# Patient Record
Sex: Male | Born: 1955 | Race: White | Hispanic: No | Marital: Married | Smoking: Never smoker
Health system: Southern US, Community
[De-identification: ages and names within clinical notes are randomized; demographics above are authoritative.]

## PROBLEM LIST (undated history)

## (undated) DIAGNOSIS — N200 Calculus of kidney: Secondary | ICD-10-CM

## (undated) DIAGNOSIS — K219 Gastro-esophageal reflux disease without esophagitis: Secondary | ICD-10-CM

## (undated) DIAGNOSIS — G4730 Sleep apnea, unspecified: Secondary | ICD-10-CM

## (undated) DIAGNOSIS — M1990 Unspecified osteoarthritis, unspecified site: Secondary | ICD-10-CM

## (undated) DIAGNOSIS — J40 Bronchitis, not specified as acute or chronic: Secondary | ICD-10-CM

## (undated) DIAGNOSIS — I10 Essential (primary) hypertension: Secondary | ICD-10-CM

## (undated) HISTORY — PX: COLONOSCOPY: SHX174

## (undated) HISTORY — PX: KNEE SURGERY: SHX244

---
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
## 2003-12-19 ENCOUNTER — Ambulatory Visit (HOSPITAL_COMMUNITY): Admission: RE | Admit: 2003-12-19 | Discharge: 2003-12-19 | Payer: Self-pay | Admitting: Urology

## 2003-12-20 ENCOUNTER — Ambulatory Visit (HOSPITAL_COMMUNITY): Admission: RE | Admit: 2003-12-20 | Discharge: 2003-12-20 | Payer: Self-pay | Admitting: Urology

## 2004-12-30 ENCOUNTER — Ambulatory Visit: Payer: Self-pay | Admitting: Internal Medicine

## 2009-02-23 HISTORY — PX: CHOLECYSTECTOMY: SHX55

## 2010-01-02 ENCOUNTER — Encounter: Admission: RE | Admit: 2010-01-02 | Discharge: 2010-01-02 | Payer: Self-pay | Admitting: Neurosurgery

## 2010-01-24 ENCOUNTER — Encounter: Admission: RE | Admit: 2010-01-24 | Discharge: 2010-01-24 | Payer: Self-pay | Admitting: Neurosurgery

## 2010-07-11 NOTE — H&P (Signed)
Benjamin Sawyer, GUCCIARDO NO.:  [id number]   MEDICAL RECORD NO.:  [id number]          PATIENT TYPE:  AMB   LOCATION:  DAY                           FACILITY:  [facility]   PHYSICIAN:  Dennie Maizes, M.D.   DATE OF BIRTH:  1955/09/08   DATE OF ADMISSION:  12/19/2003  DATE OF DISCHARGE:  LH                                HISTORY & PHYSICAL   CHIEF COMPLAINT:  Severe right flank pain radiating to the front,  right  renal calculi.   HISTORY OF PRESENT ILLNESS:  This 55 year old male has a past history of  recurrent urolithiasis.  He was having intermittent right flank pain for a  few weeks.  He had been to the Emergency Room at [facility]  for further evaluation.  A CT scan of the abdomen and pelvis done on November 28, 2003 revealed bilateral multiple renal calculi.  The largest stone on the  right side measured 7 millimeters in size.  The largest stone on the left  side measured 8 millimeters in size.  There was no evidence of any  hydronephrosis or ureteral calculi.  The patient was treated with Percocet.  He experienced severe pain and he returned to the hospital on December 07, 2003.  He was hospitalized for two days with severe pain.  He had relief of  pain and he has been discharged.  He was brought to the [facility] today  for extracorporal shock-wave lithotripsy for symptomatic right renal  calculus.   He has had several stones in the past.  He has undergone ureteroscopy and  stone extraction in 1998 and 2003.  There is no history of fever, chills,  voiding difficulty, gross hematuria or dysuria at present.   PAST MEDICAL HISTORY:  1.  Recurrent urolithiasis, status post uteroscopy and stone extraction 1998      and 2003.  2.  History of essential hypertension.   MEDICATIONS:  1.  Norvasc 5 mg 1 p.o. daily.  2.  Percocet p.r.n. for pain.   ALLERGIES:  NONE.   FAMILY HISTORY:  Positive for recurrent urolithiasis.   PHYSICAL EXAMINATION:   HEAD/EYES/EARS/NOSE/THROAT:  Normal.  NECK:  No masses.  LUNGS:  Clear to auscultation.  HEART:  Regular rate and rhythm, no murmurs.  ABDOMEN:  Soft, no palpable flank mass.  Moderate right cva tenderness was  noted,  bladder not palpable.  GU:  Penis and testes were normal.   IMPRESSION:  Right flank pain, right renal calculus, bilateral renal  calculi.   PLAN:  Extracorporal shock-wave lithotripsy of right renal calculus with  intravenous sedation in [facility].  I have discussed with the patient  regarding the diagnosis, the operative details, alternate treatments,  outcome, possible risks and complications, and he has agreed for the  procedure to be done.     Pennie Rushing   SK/MEDQ  D:  12/18/2003  T:  12/18/2003  Job:  [id number]   cc:   Kirstie Peri, MD  [address]  [place] [address]  Fax: [phone]

## 2012-08-21 ENCOUNTER — Emergency Department (HOSPITAL_COMMUNITY): Payer: Self-pay

## 2012-08-21 ENCOUNTER — Encounter (HOSPITAL_COMMUNITY): Payer: Self-pay | Admitting: *Deleted

## 2012-08-21 ENCOUNTER — Emergency Department (HOSPITAL_COMMUNITY)
Admission: EM | Admit: 2012-08-21 | Discharge: 2012-08-21 | Disposition: A | Discharge status: Home / Self Care | Payer: Self-pay | Attending: Emergency Medicine | Admitting: Emergency Medicine

## 2012-08-21 DIAGNOSIS — Z9889 Other specified postprocedural states: Secondary | ICD-10-CM | POA: Insufficient documentation

## 2012-08-21 DIAGNOSIS — S8990XA Unspecified injury of unspecified lower leg, initial encounter: Secondary | ICD-10-CM | POA: Insufficient documentation

## 2012-08-21 DIAGNOSIS — Y9389 Activity, other specified: Secondary | ICD-10-CM | POA: Insufficient documentation

## 2012-08-21 DIAGNOSIS — M79671 Pain in right foot: Secondary | ICD-10-CM

## 2012-08-21 DIAGNOSIS — Y929 Unspecified place or not applicable: Secondary | ICD-10-CM | POA: Insufficient documentation

## 2012-08-21 DIAGNOSIS — S99919A Unspecified injury of unspecified ankle, initial encounter: Secondary | ICD-10-CM | POA: Insufficient documentation

## 2012-08-21 MED ORDER — HYDROCODONE-ACETAMINOPHEN 5-325 MG PO TABS: ORAL_TABLET | ORAL | Status: DC

## 2012-08-21 NOTE — ED Notes (Signed)
Pt states that he was climbing off his pickup truck three weeks ago, injuring right foot. States that he did have swelling and pain with the initial injury, tried treating the foot at home, swelling is better but continues to have pain.

## 2012-08-21 NOTE — ED Provider Notes (Signed)
History    CSN: [id number] Arrival date & time 08/21/12  1402  First MD Initiated Contact with Patient 08/21/12 1425     Chief Complaint  Patient presents with  . Foot Pain   (Consider location/radiation/quality/duration/timing/severity/associated sxs/prior Treatment) Patient is a 57 y.o. male presenting with foot injury. The history is provided by the patient.  Foot Injury Location:  Foot Time since incident:  3 weeks Injury: yes   Foot location:  R foot Pain details:    Quality:  Aching   Radiates to:  Does not radiate   Severity:  Moderate   Onset quality:  Sudden   Timing:  Constant   Progression:  Unchanged Chronicity:  New Dislocation: no   Foreign body present:  No foreign bodies Prior injury to area:  No Relieved by:  Nothing Worsened by:  Activity and bearing weight Ineffective treatments:  Acetaminophen, NSAIDs and ice Associated symptoms: swelling   Associated symptoms: no decreased ROM, no fever, no muscle weakness, no neck pain, no numbness, no stiffness and no tingling    History reviewed. No pertinent past medical history. Past Surgical History  Procedure Laterality Date  . Knee surgery     No family history on file. History  Substance Use Topics  . Smoking status: Not on file  . Smokeless tobacco: Not on file  . Alcohol Use: Not on file    Review of Systems  Constitutional: Negative for fever and chills.  HENT: Negative for neck pain.   Genitourinary: Negative for dysuria and difficulty urinating.  Musculoskeletal: Positive for joint swelling and arthralgias. Negative for stiffness.  Skin: Negative for color change and wound.  All other systems reviewed and are negative.    Allergies  Review of patient's allergies indicates no known allergies.  Home Medications  No current outpatient prescriptions on file. BP 153/74  Pulse 77  Temp(Src) 97.8 F (36.6 C) (Oral)  Resp 14  Ht 6' (1.829 m)  Wt 250 lb (113.399 kg)  BMI 33.9 kg/m2   SpO2 98% Physical Exam  Nursing note and vitals reviewed. Constitutional: He is oriented to person, place, and time. He appears well-developed and well-nourished. No distress.  HENT:  Head: Normocephalic and atraumatic.  Cardiovascular: Normal rate, regular rhythm, normal heart sounds and intact distal pulses.   Pulmonary/Chest: Effort normal and breath sounds normal. No respiratory distress.  Musculoskeletal: He exhibits tenderness.  ttp of the lateral right foot and plantar heel, mild STS is present.  ROM is preserved.  DP pulse is brisk, distal sensation intact.  No erythema, abrasion, bruising or deformity.  No proximal tenderness  Neurological: He is alert and oriented to person, place, and time. He exhibits normal muscle tone. Coordination normal.  Skin: Skin is warm and dry.    ED Course  Procedures (including critical care time) Labs Reviewed - No data to display Dg Foot Complete Right  08/21/2012   *RADIOLOGY REPORT*  Clinical Data: Right foot injury.  Lateral pain.  Plantar pain.  RIGHT FOOT COMPLETE - 3+ VIEW  Comparison: None.  Findings: Atherosclerotic arterial calcification noted.  No malalignment at the Lisfranc joint.  Fragmented spurring noted along the anterior distal tibial rim.  Fifth metatarsal intact.  Hind foot and midfoot intact.  First digit sesamoids unremarkable.  No additional significant findings.  IMPRESSION:  1.  Fragmented spurring along the anterior tibial rim.  No specific fracture or acute bony findings to explain the patient's plantar and lateral pain.  If symptoms persist despite conservative  therapy, MRI followup may be warranted.   Original Report Authenticated By: Gaylyn Rong, M.D.     MDM     Post op shoe applied, remains NV intact.  ttp of the plantar surface of the heel x 3 weeks secondary to a fall.  I have advised pt that he may have an occult injury to the calcaneus.  Will try conservative therpy and given referral info for orthopedics.   Requests referral for Dr. Dorena Bodo.   Aseel Uhde L. Ranell Skibinski, PA-C 08/22/12 2231

## 2012-08-22 NOTE — ED Provider Notes (Signed)
Medical screening examination/treatment/procedure(s) were performed by non-physician practitioner and as supervising physician I was immediately available for consultation/collaboration.   Marrio Scribner L Latasha Buczkowski, MD 08/22/12 2247 

## 2012-08-28 ENCOUNTER — Encounter (HOSPITAL_COMMUNITY): Payer: Self-pay | Admitting: Emergency Medicine

## 2012-08-28 ENCOUNTER — Emergency Department (HOSPITAL_COMMUNITY)
Admission: EM | Admit: 2012-08-28 | Discharge: 2012-08-28 | Disposition: A | Discharge status: Home / Self Care | Payer: Self-pay | Attending: Emergency Medicine | Admitting: Emergency Medicine

## 2012-08-28 ENCOUNTER — Emergency Department (HOSPITAL_COMMUNITY): Payer: Self-pay

## 2012-08-28 DIAGNOSIS — M549 Dorsalgia, unspecified: Secondary | ICD-10-CM | POA: Insufficient documentation

## 2012-08-28 DIAGNOSIS — Z87442 Personal history of urinary calculi: Secondary | ICD-10-CM | POA: Insufficient documentation

## 2012-08-28 DIAGNOSIS — Z79899 Other long term (current) drug therapy: Secondary | ICD-10-CM | POA: Insufficient documentation

## 2012-08-28 HISTORY — DX: Calculus of kidney: N20.0

## 2012-08-28 LAB — CBC WITH DIFFERENTIAL/PLATELET
Basophils Absolute: 0 10*3/uL (ref 0.0–0.1)
Basophils Relative: 0 % (ref 0–1)
Eosinophils Absolute: 0.1 10*3/uL (ref 0.0–0.7)
Eosinophils Relative: 2 % (ref 0–5)
HCT: 42.2 % (ref 39.0–52.0)
Hemoglobin: 15.4 g/dL (ref 13.0–17.0)
Lymphocytes Relative: 25 % (ref 12–46)
Lymphs Abs: 1.2 10*3/uL (ref 0.7–4.0)
MCH: 32.5 pg (ref 26.0–34.0)
MCHC: 36.5 g/dL — ABNORMAL HIGH (ref 30.0–36.0)
MCV: 89 fL (ref 78.0–100.0)
Monocytes Absolute: 0.6 10*3/uL (ref 0.1–1.0)
Monocytes Relative: 13 % — ABNORMAL HIGH (ref 3–12)
Neutro Abs: 2.9 10*3/uL (ref 1.7–7.7)
Neutrophils Relative %: 60 % (ref 43–77)
Platelets: 176 10*3/uL (ref 150–400)
RBC: 4.74 MIL/uL (ref 4.22–5.81)
RDW: 12.5 % (ref 11.5–15.5)
WBC: 4.8 10*3/uL (ref 4.0–10.5)

## 2012-08-28 LAB — BASIC METABOLIC PANEL
BUN: 12 mg/dL (ref 6–23)
CO2: 27 mEq/L (ref 19–32)
Calcium: 9.1 mg/dL (ref 8.4–10.5)
Chloride: 107 mEq/L (ref 96–112)
Creatinine, Ser: 1.06 mg/dL (ref 0.50–1.35)
GFR calc Af Amer: 88 mL/min — ABNORMAL LOW (ref >90)
GFR calc non Af Amer: 76 mL/min — ABNORMAL LOW (ref >90)
Glucose, Bld: 94 mg/dL (ref 70–99)
Potassium: 3.7 mEq/L (ref 3.5–5.1)
Sodium: 141 mEq/L (ref 135–145)

## 2012-08-28 MED ORDER — HYDROMORPHONE HCL PF 1 MG/ML IJ SOLN
1.0000 mg | Freq: Once | INTRAMUSCULAR | Status: AC
  Administered 2012-08-28: 1 mg via INTRAVENOUS
  Filled 2012-08-28: qty 1

## 2012-08-28 MED ORDER — SODIUM CHLORIDE 0.9 % IV SOLN: INTRAVENOUS | Status: DC

## 2012-08-28 MED ORDER — SODIUM CHLORIDE 0.9 % IV BOLUS (SEPSIS)
1000.0000 mL | Freq: Once | INTRAVENOUS | Status: AC
  Administered 2012-08-28: 1000 mL via INTRAVENOUS

## 2012-08-28 MED ORDER — ONDANSETRON HCL 4 MG/2ML IJ SOLN
4.0000 mg | Freq: Once | INTRAMUSCULAR | Status: AC
  Administered 2012-08-28: 4 mg via INTRAVENOUS
  Filled 2012-08-28: qty 2

## 2012-08-28 MED ORDER — HYDROCODONE-ACETAMINOPHEN 5-325 MG PO TABS: 1.0000 | ORAL_TABLET | Freq: Four times a day (QID) | ORAL | Status: DC | PRN

## 2012-08-28 NOTE — ED Notes (Signed)
r flank pain x 2 days. Denies urinary changes. Hx of kidney stones and pt states feels like one. nad

## 2012-08-28 NOTE — ED Provider Notes (Signed)
History    This chart was scribed for Shelda Jakes, MD, MD by Ashley Jacobs, ED Scribe. The patient was seen in room [facility] and the patient's care was started at 3:50 pm.  CSN: [id number] Arrival date & time 08/28/12  1520    Chief Complaint  Patient presents with  . Flank Pain    The history is provided by the patient and medical records. No language interpreter was used.   HPI Comments: Benjamin Sawyer is a 57 y.o. male who presents to the Emergency Department complaining of intermittent right flank pain that presented 2 days PTA .Pt reports the severity of the pain is 6/10 and sharp in his R flank. Pt denies any radiation of pain  Pt reports nothing seems to relieve or worsen pain. Pt denies hematuria, fever chills. Pt has a hx of kidney stones and feels that today's symptoms are similar. Pt report Dr. Lorna Dibble is PCP. Past Medical History  Diagnosis Date  . Kidney stones    Past Surgical History  Procedure Laterality Date  . Knee surgery     History reviewed. No pertinent family history. History  Substance Use Topics  . Smoking status: Never Smoker   . Smokeless tobacco: Not on file  . Alcohol Use: No    Review of Systems  Constitutional: Negative for fever and chills.  HENT: Negative for neck pain.   Cardiovascular: Negative for chest pain.  Gastrointestinal: Negative for vomiting, abdominal pain, diarrhea and blood in stool.  Genitourinary: Positive for flank pain (R). Negative for dysuria and hematuria.  Musculoskeletal: Positive for back pain. Negative for joint swelling.  Skin: Negative for rash.  Neurological: Negative for headaches.  Hematological: Does not bruise/bleed easily.    Allergies  Review of patient's allergies indicates no known allergies.  Home Medications   Current Outpatient Rx  Name  Route  Sig  Dispense  Refill  . amLODipine (NORVASC) 5 MG tablet   Oral   Take 5 mg by mouth daily.         Marland Kitchen lisinopril (PRINIVIL,ZESTRIL)  20 MG tablet   Oral   Take 20 mg by mouth daily.         . naproxen sodium (ALEVE) 220 MG tablet   Oral   Take 440 mg by mouth daily as needed. pain         . HYDROcodone-acetaminophen (NORCO/VICODIN) 5-325 MG per tablet   Oral   Take 1-2 tablets by mouth every 6 (six) hours as needed for pain.   15 tablet   0    BP 142/74  Pulse 67  Temp(Src) 97.4 F (36.3 C) (Oral)  Resp 20  Ht 6' (1.829 m)  Wt 250 lb (113.399 kg)  BMI 33.9 kg/m2  SpO2 98% Physical Exam  Constitutional: He is oriented to person, place, and time. He appears well-developed and well-nourished. No distress.  Eyes: EOM are normal.  Cardiovascular: Normal rate and regular rhythm.   Pulmonary/Chest: Breath sounds normal.  Abdominal: Soft. Bowel sounds are normal. There is no tenderness.  Musculoskeletal: He exhibits no tenderness.  Left knee brace  Neurological: He is alert and oriented to person, place, and time. No cranial nerve deficit. He exhibits normal muscle tone. Coordination normal.  Skin: Skin is warm. He is not diaphoretic.    ED Course  Procedures (including critical care time) DIAGNOSTIC STUDIES: Oxygen Saturation is 98% on room air, normal by my interpretation.    COORDINATION OF CARE: 3:55 PM Discussed course  of care with pt which includes blood work,Zofran and dilaudid injectino . Pt understands and agrees.  Labs Reviewed  CBC WITH DIFFERENTIAL - Abnormal; Notable for the following:    MCHC 36.5 (*)    Monocytes Relative 13 (*)    All other components within normal limits  BASIC METABOLIC PANEL - Abnormal; Notable for the following:    GFR calc non Af Amer 76 (*)    GFR calc Af Amer 88 (*)    All other components within normal limits    Results for orders placed during the hospital encounter of 08/28/12  CBC WITH DIFFERENTIAL      Result Value Range   WBC 4.8  4.0 - 10.5 K/uL   RBC 4.74  4.22 - 5.81 MIL/uL   Hemoglobin 15.4  13.0 - 17.0 g/dL   HCT 16.1  09.6 - 04.5 %   MCV  89.0  78.0 - 100.0 fL   MCH 32.5  26.0 - 34.0 pg   MCHC 36.5 (*) 30.0 - 36.0 g/dL   RDW 40.9  81.1 - 91.4 %   Platelets 176  150 - 400 K/uL   Neutrophils Relative % 60  43 - 77 %   Neutro Abs 2.9  1.7 - 7.7 K/uL   Lymphocytes Relative 25  12 - 46 %   Lymphs Abs 1.2  0.7 - 4.0 K/uL   Monocytes Relative 13 (*) 3 - 12 %   Monocytes Absolute 0.6  0.1 - 1.0 K/uL   Eosinophils Relative 2  0 - 5 %   Eosinophils Absolute 0.1  0.0 - 0.7 K/uL   Basophils Relative 0  0 - 1 %   Basophils Absolute 0.0  0.0 - 0.1 K/uL  BASIC METABOLIC PANEL      Result Value Range   Sodium 141  135 - 145 mEq/L   Potassium 3.7  3.5 - 5.1 mEq/L   Chloride 107  96 - 112 mEq/L   CO2 27  19 - 32 mEq/L   Glucose, Bld 94  70 - 99 mg/dL   BUN 12  6 - 23 mg/dL   Creatinine, Ser 7.82  0.50 - 1.35 mg/dL   Calcium 9.1  8.4 - 95.6 mg/dL   GFR calc non Af Amer 76 (*) >90 mL/min   GFR calc Af Amer 88 (*) >90 mL/min     Ct Abdomen Pelvis Wo Contrast  08/28/2012   *RADIOLOGY REPORT*  Clinical Data: Right flank pain.  CT ABDOMEN AND PELVIS WITHOUT CONTRAST  Technique:  Multidetector CT imaging of the abdomen and pelvis was performed following the standard protocol without intravenous contrast.  Comparison: 09/26/2004  Findings: Lung bases are clear.  No effusions.  Prior cholecystectomy.  Liver, spleen, pancreas, adrenals have an unremarkable unenhanced appearance.  There are punctate nonobstructing bilateral renal stones.  No ureteral stones.  No hydronephrosis.  Urinary bladder and prostate are unremarkable.  Stomach, large and small bowel grossly unremarkable.  No acute bony abnormality.  No free fluid, free air or adenopathy.  Postoperative changes in the lumbar spine.  IMPRESSION: Bilateral nephrolithiasis.  No ureteral stones or hydronephrosis.   Original Report Authenticated By: Charlett Nose, M.D.   1. Back pain     MDM  No evidence ureteral stones on CT scan. Etiology of pain may be musculoskeletal. We'll treat with pain  medication patient will return for new or worse symptoms. Work note provided. Patient in no acute distress in the emergency Department nontoxic. Renal function is  normal no leukocytosis    I personally performed the services described in this documentation, which was scribed in my presence. The recorded information has been reviewed and is accurate.     Shelda Jakes, MD 08/28/12 [phone]

## 2014-04-02 ENCOUNTER — Ambulatory Visit: Payer: Self-pay | Admitting: Physician Assistant

## 2014-04-02 NOTE — Pre-Procedure Instructions (Signed)
Candice CampJackie D Cupples  04/02/2014   Your procedure is scheduled on:  Friday February 19,2016 at 0730 AM  [facility] AM.  Call this number if you have problems the morning of surgery: [phone]   Remember:   Do not eat food or drink liquids after midnight Thursday 04/12/14   Take these medicines the morning of surgery with A SIP OF WATER:Norvasc, and Hydrocodone-acetaminophen if needed for pain.  Stop Naproxen, Aleve, Advil, Ibuprofen , Aspirin, Vitamins and any Herbal meds 7 days prior to surgery 04/06/14.   Do not wear jewelry.  Do not wear lotions, powders, or colognes.. You may wear deodorant.             Men may shave face and neck.  Do not bring valuables to the hospital.  [facility] not responsible                  for any belongings or valuables.               Contacts, dentures or bridgework may not be worn into surgery.  Leave suitcase in the car. After surgery it may be brought to your room.  For patients admitted to the hospital, discharge time is determined by your                treatment team.               Patients discharged the day of surgery will not be allowed to drive  home.    Special Instructions: [facility] - Preparing for Surgery  Before surgery, you can play an important role.  Because skin is not sterile, your skin needs to be as free of germs as possible.  You can reduce the number of germs on you skin by washing with CHG (chlorahexidine gluconate) soap before surgery.  CHG is an antiseptic cleaner which kills germs and bonds with the skin to continue killing germs even after washing.  Please DO NOT use if you have an allergy to CHG or antibacterial soaps.  If your skin becomes reddened/irritated stop using the CHG and inform your nurse when you arrive at Short Stay.  Do not shave (including legs and underarms) for at least 48 hours prior to the first CHG shower.  You may shave your face.  Please follow these  instructions carefully:   1.  Shower with CHG Soap the night before surgery and the                                morning of Surgery.  2.  If you choose to wash your hair, wash your hair first as usual with your       normal shampoo.  3.  After you shampoo, rinse your hair and body thoroughly to remove the                      Shampoo.  4.  Use CHG as you would any other liquid soap.  You can apply chg directly       to the skin and wash gently with scrungie or a clean washcloth.  5.  Apply the CHG Soap to your body ONLY FROM THE NECK DOWN.        Do not use on open wounds or open sores.  Avoid contact with your eyes,  ears, mouth and genitals (private parts).  Wash genitals (private parts)       with your normal soap.  6.  Wash thoroughly, paying special attention to the area where your surgery        will be performed.  7.  Thoroughly rinse your body with warm water from the neck down.  8.  DO NOT shower/wash with your normal soap after using and rinsing off       the CHG Soap.  9.  Pat yourself dry with a clean towel.            10.  Wear clean pajamas.            11.  Place clean sheets on your bed the night of your first shower and do not        sleep with pets.  Day of Surgery  Do not apply any lotions/deoderants the morning of surgery.  Please wear clean clothes to the hospital/surgery center.      Please read over the following fact sheets that you were given: Pain Booklet, Coughing and Deep Breathing, Blood Transfusion Information, MRSA Information and Surgical Site Infection Prevention

## 2014-04-02 NOTE — H&P (Signed)
TOTAL KNEE ADMISSION H&P  Patient is being admitted for left total knee arthroplasty.  Subjective:  Chief Complaint:left knee pain.  HPI: Benjamin Sawyer, 59 y.o. male, has a history of pain and functional disability in the left knee due to arthritis and has failed non-surgical conservative treatments for greater than 12 weeks to includeNSAID's and/or analgesics, corticosteriod injections, viscosupplementation injections, use of assistive devices and activity modification.  Onset of symptoms was gradual, starting 10 years ago with gradually worsening course since that time. The patient noted prior procedures on the knee to include  arthroscopy, menisectomy and microfracture on the left knee(s).  Patient currently rates pain in the left knee(s) at 9 out of 10 with activity. Patient has night pain, worsening of pain with activity and weight bearing, pain that interferes with activities of daily living, pain with passive range of motion, crepitus and joint swelling.  Patient has evidence of periarticular osteophytes and joint space narrowing by imaging studies.There is no active infection.  There are no active problems to display for this patient.  Past Medical History  Diagnosis Date  . Kidney stones     Past Surgical History  Procedure Laterality Date  . Knee surgery       (Not in a hospital admission) No Known Allergies  History  Substance Use Topics  . Smoking status: Never Smoker   . Smokeless tobacco: Not on file  . Alcohol Use: No    No family history on file.   Review of Systems  Constitutional: Negative.   HENT: Positive for tinnitus. Negative for congestion, ear discharge, ear pain, hearing loss, nosebleeds and sore throat.   Eyes: Negative.   Respiratory: Negative.  Negative for stridor.   Cardiovascular: Negative.   Gastrointestinal: Positive for diarrhea and constipation. Negative for heartburn, nausea, vomiting, abdominal pain, blood in stool and melena.   Genitourinary: Positive for hematuria. Negative for dysuria, urgency, frequency and flank pain.  Musculoskeletal: Positive for joint pain. Negative for falls.  Skin: Negative.   Neurological: Negative.  Negative for headaches.  Endo/Heme/Allergies: Negative.   Psychiatric/Behavioral: Negative.     Objective:  Physical Exam  Constitutional: He is oriented to person, place, and time. He appears well-developed and well-nourished. No distress.  HENT:  Head: Normocephalic and atraumatic.  Nose: Nose normal.  Eyes: Conjunctivae and EOM are normal. Pupils are equal, round, and reactive to light.  Neck: Normal range of motion. Neck supple.  Cardiovascular: Normal rate, regular rhythm, normal heart sounds and intact distal pulses.   Respiratory: Effort normal and breath sounds normal. No respiratory distress. He has no wheezes. He exhibits no tenderness.  GI: Soft. Bowel sounds are normal. He exhibits no distension. There is no tenderness.  Musculoskeletal:       Left knee: He exhibits swelling and bony tenderness. He exhibits normal range of motion, no erythema, no LCL laxity and no MCL laxity. Tenderness found.  Lymphadenopathy:    He has no cervical adenopathy.  Neurological: He is alert and oriented to person, place, and time. No cranial nerve deficit.  Skin: Skin is warm and dry. No rash noted. No erythema. No pallor.  Psychiatric: He has a normal mood and affect. His behavior is normal.    Vital signs in last 24 hours: @  Labs:   Estimated body mass index is 33.90 kg/(m^2) as calculated from the following:   Height as of 08/28/12: 6' (1.829 m).   Weight as of 08/28/12: 113.399 kg (250 lb).   Imaging Review  Plain radiographs demonstrate moderate degenerative joint disease of the left knee(s). The overall alignment ismild varus. The bone quality appears to be good for age and reported activity level.  Assessment/Plan:  End stage arthritis, left knee   The patient  history, physical examination, clinical judgment of the provider and imaging studies are consistent with end stage degenerative joint disease of the left knee(s) and total knee arthroplasty is deemed medically necessary. The treatment options including medical management, injection therapy arthroscopy and arthroplasty were discussed at length. The risks and benefits of total knee arthroplasty were presented and reviewed. The risks due to aseptic loosening, infection, stiffness, patella tracking problems, thromboembolic complications and other imponderables were discussed. The patient acknowledged the explanation, agreed to proceed with the plan and consent was signed. Patient is being admitted for inpatient treatment for surgery, pain control, PT, OT, prophylactic antibiotics, VTE prophylaxis, progressive ambulation and ADL's and discharge planning. The patient is planning to be discharged home with home health services

## 2014-04-03 ENCOUNTER — Ambulatory Visit (HOSPITAL_COMMUNITY)
Admission: RE | Admit: 2014-04-03 | Discharge: 2014-04-03 | Disposition: A | Discharge status: Home / Self Care | Payer: Medicare Other | Source: Ambulatory Visit | Attending: Physician Assistant | Admitting: Physician Assistant

## 2014-04-03 ENCOUNTER — Encounter (HOSPITAL_COMMUNITY): Payer: Self-pay

## 2014-04-03 ENCOUNTER — Encounter (HOSPITAL_COMMUNITY)
Admission: RE | Admit: 2014-04-03 | Discharge: 2014-04-03 | Disposition: A | Discharge status: Home / Self Care | Payer: Medicare Other | Source: Ambulatory Visit | Attending: Orthopedic Surgery | Admitting: Orthopedic Surgery

## 2014-04-03 DIAGNOSIS — I10 Essential (primary) hypertension: Secondary | ICD-10-CM | POA: Insufficient documentation

## 2014-04-03 DIAGNOSIS — M13862 Other specified arthritis, left knee: Secondary | ICD-10-CM | POA: Insufficient documentation

## 2014-04-03 DIAGNOSIS — Z01812 Encounter for preprocedural laboratory examination: Secondary | ICD-10-CM | POA: Insufficient documentation

## 2014-04-03 DIAGNOSIS — Z01818 Encounter for other preprocedural examination: Secondary | ICD-10-CM | POA: Insufficient documentation

## 2014-04-03 DIAGNOSIS — M1712 Unilateral primary osteoarthritis, left knee: Secondary | ICD-10-CM

## 2014-04-03 HISTORY — DX: Gastro-esophageal reflux disease without esophagitis: K21.9

## 2014-04-03 HISTORY — DX: Essential (primary) hypertension: I10

## 2014-04-03 HISTORY — DX: Sleep apnea, unspecified: G47.30

## 2014-04-03 HISTORY — DX: Bronchitis, not specified as acute or chronic: J40

## 2014-04-03 HISTORY — DX: Unspecified osteoarthritis, unspecified site: M19.90

## 2014-04-03 LAB — COMPREHENSIVE METABOLIC PANEL
ALBUMIN: 4.6 g/dL (ref 3.5–5.2)
ALK PHOS: 60 U/L (ref 39–117)
ALT: 36 U/L (ref 0–53)
ANION GAP: 8 (ref 5–15)
AST: 28 U/L (ref 0–37)
BUN: 8 mg/dL (ref 6–23)
CO2: 26 mmol/L (ref 19–32)
Calcium: 9.4 mg/dL (ref 8.4–10.5)
Chloride: 105 mmol/L (ref 96–112)
Creatinine, Ser: 0.99 mg/dL (ref 0.50–1.35)
GFR calc Af Amer: >90 mL/min (ref >90)
GFR calc non Af Amer: 88 mL/min — ABNORMAL LOW (ref >90)
Glucose, Bld: 107 mg/dL — ABNORMAL HIGH (ref 70–99)
POTASSIUM: 4 mmol/L (ref 3.5–5.1)
SODIUM: 139 mmol/L (ref 135–145)
TOTAL PROTEIN: 7.4 g/dL (ref 6.0–8.3)
Total Bilirubin: 2.5 mg/dL — ABNORMAL HIGH (ref 0.3–1.2)

## 2014-04-03 LAB — CBC WITH DIFFERENTIAL/PLATELET
Basophils Absolute: 0 10*3/uL (ref 0.0–0.1)
Basophils Relative: 0 % (ref 0–1)
EOS ABS: 0.1 10*3/uL (ref 0.0–0.7)
EOS PCT: 1 % (ref 0–5)
HCT: 47.2 % (ref 39.0–52.0)
Hemoglobin: 17.3 g/dL — ABNORMAL HIGH (ref 13.0–17.0)
Lymphocytes Relative: 23 % (ref 12–46)
Lymphs Abs: 1.5 10*3/uL (ref 0.7–4.0)
MCH: 32.6 pg (ref 26.0–34.0)
MCHC: 36.7 g/dL — AB (ref 30.0–36.0)
MCV: 88.9 fL (ref 78.0–100.0)
Monocytes Absolute: 0.6 10*3/uL (ref 0.1–1.0)
Monocytes Relative: 9 % (ref 3–12)
Neutro Abs: 4.2 10*3/uL (ref 1.7–7.7)
Neutrophils Relative %: 67 % (ref 43–77)
PLATELETS: 207 10*3/uL (ref 150–400)
RBC: 5.31 MIL/uL (ref 4.22–5.81)
RDW: 12.7 % (ref 11.5–15.5)
WBC: 6.3 10*3/uL (ref 4.0–10.5)

## 2014-04-03 LAB — URINALYSIS, ROUTINE W REFLEX MICROSCOPIC
Bilirubin Urine: NEGATIVE
GLUCOSE, UA: NEGATIVE mg/dL
Hgb urine dipstick: NEGATIVE
KETONES UR: NEGATIVE mg/dL
Leukocytes, UA: NEGATIVE
Nitrite: NEGATIVE
Protein, ur: NEGATIVE mg/dL
Specific Gravity, Urine: 1.009 (ref 1.005–1.030)
UROBILINOGEN UA: 1 mg/dL (ref 0.0–1.0)
pH: 6.5 (ref 5.0–8.0)

## 2014-04-03 LAB — SURGICAL PCR SCREEN
MRSA, PCR: NEGATIVE
Staphylococcus aureus: NEGATIVE

## 2014-04-03 LAB — ABO/RH: ABO/RH(D): A POS

## 2014-04-03 LAB — TYPE AND SCREEN
ABO/RH(D): A POS
Antibody Screen: NEGATIVE

## 2014-04-03 LAB — PROTIME-INR
INR: 1.01 (ref 0.00–1.49)
Prothrombin Time: 13.4 seconds (ref 11.6–15.2)

## 2014-04-03 LAB — APTT: aPTT: 31 seconds (ref 24–37)

## 2014-04-03 MED ORDER — CHLORHEXIDINE GLUCONATE 4 % EX LIQD: 60.0000 mL | Freq: Once | CUTANEOUS | Status: DC

## 2014-04-04 LAB — URINE CULTURE
Colony Count: NO GROWTH
Culture: NO GROWTH

## 2014-04-10 HISTORY — PX: TOTAL KNEE ARTHROPLASTY: SHX125

## 2014-04-12 MED ORDER — TRANEXAMIC ACID 100 MG/ML IV SOLN
1000.0000 mg | INTRAVENOUS | Status: AC
  Administered 2014-04-13: 1000 mg via INTRAVENOUS
  Filled 2014-04-12: qty 10

## 2014-04-12 MED ORDER — SODIUM CHLORIDE 0.9 % IV SOLN: INTRAVENOUS | Status: DC

## 2014-04-12 MED ORDER — CEFAZOLIN SODIUM-DEXTROSE 2-3 GM-% IV SOLR
2.0000 g | INTRAVENOUS | Status: AC
  Administered 2014-04-13: 2 g via INTRAVENOUS
  Filled 2014-04-12: qty 50

## 2014-04-13 ENCOUNTER — Inpatient Hospital Stay (HOSPITAL_COMMUNITY): Payer: Medicare Other | Admitting: Certified Registered Nurse Anesthetist

## 2014-04-13 ENCOUNTER — Inpatient Hospital Stay (HOSPITAL_COMMUNITY)
Admission: RE | Admit: 2014-04-13 | Discharge: 2014-04-16 | DRG: 470 | Disposition: A | Discharge status: Home Health | Payer: Medicare Other | Source: Ambulatory Visit | Attending: Orthopedic Surgery | Admitting: Orthopedic Surgery

## 2014-04-13 ENCOUNTER — Encounter (HOSPITAL_COMMUNITY)
Admission: RE | Disposition: A | Discharge status: Home Health | Payer: Self-pay | Source: Ambulatory Visit | Attending: Orthopedic Surgery

## 2014-04-13 ENCOUNTER — Encounter (HOSPITAL_COMMUNITY): Payer: Self-pay | Admitting: *Deleted

## 2014-04-13 ENCOUNTER — Inpatient Hospital Stay (HOSPITAL_COMMUNITY): Payer: Medicare Other | Admitting: Emergency Medicine

## 2014-04-13 DIAGNOSIS — K219 Gastro-esophageal reflux disease without esophagitis: Secondary | ICD-10-CM | POA: Diagnosis present

## 2014-04-13 DIAGNOSIS — Z79899 Other long term (current) drug therapy: Secondary | ICD-10-CM

## 2014-04-13 DIAGNOSIS — M1732 Unilateral post-traumatic osteoarthritis, left knee: Secondary | ICD-10-CM | POA: Diagnosis present

## 2014-04-13 DIAGNOSIS — D62 Acute posthemorrhagic anemia: Secondary | ICD-10-CM | POA: Diagnosis not present

## 2014-04-13 DIAGNOSIS — Z7901 Long term (current) use of anticoagulants: Secondary | ICD-10-CM

## 2014-04-13 DIAGNOSIS — I10 Essential (primary) hypertension: Secondary | ICD-10-CM | POA: Diagnosis present

## 2014-04-13 DIAGNOSIS — M1712 Unilateral primary osteoarthritis, left knee: Secondary | ICD-10-CM | POA: Diagnosis present

## 2014-04-13 DIAGNOSIS — M25562 Pain in left knee: Secondary | ICD-10-CM | POA: Diagnosis present

## 2014-04-13 HISTORY — PX: TOTAL KNEE ARTHROPLASTY: SHX125

## 2014-04-13 SURGERY — ARTHROPLASTY, KNEE, TOTAL
Anesthesia: Regional | Site: Knee | Laterality: Left

## 2014-04-13 MED ORDER — FENTANYL CITRATE 0.05 MG/ML IJ SOLN
INTRAMUSCULAR | Status: AC
  Filled 2014-04-13: qty 5

## 2014-04-13 MED ORDER — MENTHOL 3 MG MT LOZG: 1.0000 | LOZENGE | OROMUCOSAL | Status: DC | PRN

## 2014-04-13 MED ORDER — ONDANSETRON HCL 4 MG PO TABS: 4.0000 mg | ORAL_TABLET | Freq: Four times a day (QID) | ORAL | Status: DC | PRN

## 2014-04-13 MED ORDER — PROPOFOL 10 MG/ML IV BOLUS
INTRAVENOUS | Status: AC
  Filled 2014-04-13: qty 20

## 2014-04-13 MED ORDER — FENTANYL CITRATE 0.05 MG/ML IJ SOLN
INTRAMUSCULAR | Status: DC | PRN
  Administered 2014-04-13 (×5): 50 ug via INTRAVENOUS
  Administered 2014-04-13: 100 ug via INTRAVENOUS
  Administered 2014-04-13: 50 ug via INTRAVENOUS

## 2014-04-13 MED ORDER — AMLODIPINE BESYLATE 5 MG PO TABS
5.0000 mg | ORAL_TABLET | Freq: Every day | ORAL | Status: DC
  Administered 2014-04-14 – 2014-04-16 (×3): 5 mg via ORAL
  Filled 2014-04-13 (×3): qty 1

## 2014-04-13 MED ORDER — HYDROMORPHONE HCL 1 MG/ML IJ SOLN
0.2500 mg | INTRAMUSCULAR | Status: DC | PRN
  Administered 2014-04-13 (×4): 0.5 mg via INTRAVENOUS

## 2014-04-13 MED ORDER — HYDROMORPHONE HCL 1 MG/ML IJ SOLN
INTRAMUSCULAR | Status: AC
  Administered 2014-04-13: 1 mg via INTRAVENOUS
  Filled 2014-04-13: qty 1

## 2014-04-13 MED ORDER — LACTATED RINGERS IV SOLN
INTRAVENOUS | Status: DC | PRN
  Administered 2014-04-13 (×2): via INTRAVENOUS

## 2014-04-13 MED ORDER — CEFAZOLIN SODIUM-DEXTROSE 2-3 GM-% IV SOLR
2.0000 g | Freq: Four times a day (QID) | INTRAVENOUS | Status: AC
  Administered 2014-04-13 (×2): 2 g via INTRAVENOUS
  Filled 2014-04-13 (×2): qty 50

## 2014-04-13 MED ORDER — CYCLOBENZAPRINE HCL 10 MG PO TABS
10.0000 mg | ORAL_TABLET | Freq: Three times a day (TID) | ORAL | Status: DC | PRN
  Administered 2014-04-13 – 2014-04-15 (×7): 10 mg via ORAL
  Filled 2014-04-13 (×9): qty 1

## 2014-04-13 MED ORDER — DIPHENHYDRAMINE HCL 12.5 MG/5ML PO ELIX
12.5000 mg | ORAL_SOLUTION | ORAL | Status: DC | PRN
Start: 2014-04-13 — End: 2014-04-16

## 2014-04-13 MED ORDER — OXYCODONE HCL 5 MG PO TABS
ORAL_TABLET | ORAL | Status: AC
  Administered 2014-04-13: 10 mg via ORAL
  Filled 2014-04-13: qty 2

## 2014-04-13 MED ORDER — MIDAZOLAM HCL 2 MG/2ML IJ SOLN
INTRAMUSCULAR | Status: AC
  Filled 2014-04-13: qty 2

## 2014-04-13 MED ORDER — ACETAMINOPHEN 325 MG PO TABS
ORAL_TABLET | ORAL | Status: AC
  Filled 2014-04-13: qty 1

## 2014-04-13 MED ORDER — BISACODYL 10 MG RE SUPP
10.0000 mg | Freq: Every day | RECTAL | Status: DC | PRN
  Administered 2014-04-16: 10 mg via RECTAL
  Filled 2014-04-13: qty 1

## 2014-04-13 MED ORDER — MIDAZOLAM HCL 5 MG/5ML IJ SOLN
INTRAMUSCULAR | Status: DC | PRN
  Administered 2014-04-13: 2 mg via INTRAVENOUS

## 2014-04-13 MED ORDER — ONDANSETRON HCL 4 MG/2ML IJ SOLN: 4.0000 mg | Freq: Four times a day (QID) | INTRAMUSCULAR | Status: DC | PRN

## 2014-04-13 MED ORDER — DOCUSATE SODIUM 100 MG PO CAPS
100.0000 mg | ORAL_CAPSULE | Freq: Two times a day (BID) | ORAL | Status: DC
  Administered 2014-04-13 – 2014-04-16 (×7): 100 mg via ORAL
  Filled 2014-04-13 (×7): qty 1

## 2014-04-13 MED ORDER — SENNOSIDES-DOCUSATE SODIUM 8.6-50 MG PO TABS: 1.0000 | ORAL_TABLET | Freq: Every evening | ORAL | Status: DC | PRN

## 2014-04-13 MED ORDER — HYDROMORPHONE HCL 1 MG/ML IJ SOLN
1.0000 mg | INTRAMUSCULAR | Status: DC | PRN
  Administered 2014-04-13 – 2014-04-14 (×5): 1 mg via INTRAVENOUS
  Filled 2014-04-13 (×5): qty 1

## 2014-04-13 MED ORDER — HYDROMORPHONE HCL 1 MG/ML IJ SOLN
INTRAMUSCULAR | Status: AC
  Administered 2014-04-13: 0.5 mg via INTRAVENOUS
  Filled 2014-04-13: qty 1

## 2014-04-13 MED ORDER — PHENOL 1.4 % MT LIQD: 1.0000 | OROMUCOSAL | Status: DC | PRN

## 2014-04-13 MED ORDER — BUPIVACAINE-EPINEPHRINE (PF) 0.5% -1:200000 IJ SOLN
INTRAMUSCULAR | Status: DC | PRN
  Administered 2014-04-13: 30 mL via PERINEURAL

## 2014-04-13 MED ORDER — APIXABAN 2.5 MG PO TABS
2.5000 mg | ORAL_TABLET | Freq: Two times a day (BID) | ORAL | Status: DC
  Administered 2014-04-14 – 2014-04-16 (×5): 2.5 mg via ORAL
  Filled 2014-04-13 (×8): qty 1

## 2014-04-13 MED ORDER — METOCLOPRAMIDE HCL 10 MG PO TABS: 5.0000 mg | ORAL_TABLET | Freq: Three times a day (TID) | ORAL | Status: DC | PRN

## 2014-04-13 MED ORDER — SODIUM CHLORIDE 0.9 % IV SOLN
INTRAVENOUS | Status: DC
  Administered 2014-04-13 (×2): via INTRAVENOUS

## 2014-04-13 MED ORDER — BUPIVACAINE-EPINEPHRINE (PF) 0.5% -1:200000 IJ SOLN
INTRAMUSCULAR | Status: AC
  Filled 2014-04-13: qty 30

## 2014-04-13 MED ORDER — CHLORHEXIDINE GLUCONATE 4 % EX LIQD
60.0000 mL | Freq: Once | CUTANEOUS | Status: DC
  Filled 2014-04-13: qty 60

## 2014-04-13 MED ORDER — OXYCODONE-ACETAMINOPHEN 5-325 MG PO TABS: 1.0000 | ORAL_TABLET | ORAL | Status: AC | PRN

## 2014-04-13 MED ORDER — ACETAMINOPHEN 650 MG RE SUPP
650.0000 mg | Freq: Four times a day (QID) | RECTAL | Status: DC | PRN
  Filled 2014-04-13: qty 1

## 2014-04-13 MED ORDER — LISINOPRIL 20 MG PO TABS
20.0000 mg | ORAL_TABLET | Freq: Every day | ORAL | Status: DC
  Administered 2014-04-14 – 2014-04-16 (×3): 20 mg via ORAL
  Filled 2014-04-13 (×3): qty 1

## 2014-04-13 MED ORDER — OXYCODONE HCL 5 MG PO TABS
ORAL_TABLET | ORAL | Status: AC
  Filled 2014-04-13: qty 1

## 2014-04-13 MED ORDER — SODIUM CHLORIDE 0.9 % IR SOLN
Status: DC | PRN
  Administered 2014-04-13 (×2): 1000 mL

## 2014-04-13 MED ORDER — PROPOFOL 10 MG/ML IV BOLUS
INTRAVENOUS | Status: DC | PRN
  Administered 2014-04-13: 200 mg via INTRAVENOUS

## 2014-04-13 MED ORDER — ONDANSETRON HCL 4 MG/2ML IJ SOLN
INTRAMUSCULAR | Status: DC | PRN
  Administered 2014-04-13: 4 mg via INTRAVENOUS

## 2014-04-13 MED ORDER — MEPERIDINE HCL 25 MG/ML IJ SOLN: 6.2500 mg | INTRAMUSCULAR | Status: DC | PRN

## 2014-04-13 MED ORDER — APIXABAN 2.5 MG PO TABS: 2.5000 mg | ORAL_TABLET | Freq: Two times a day (BID) | ORAL | Status: AC

## 2014-04-13 MED ORDER — OXYCODONE HCL 5 MG PO TABS
5.0000 mg | ORAL_TABLET | ORAL | Status: DC | PRN
  Administered 2014-04-13 – 2014-04-14 (×6): 10 mg via ORAL
  Administered 2014-04-14: 5 mg via ORAL
  Administered 2014-04-14 – 2014-04-16 (×8): 10 mg via ORAL
  Filled 2014-04-13 (×7): qty 2
  Filled 2014-04-13: qty 1
  Filled 2014-04-13 (×6): qty 2

## 2014-04-13 MED ORDER — LIDOCAINE HCL (CARDIAC) 20 MG/ML IV SOLN
INTRAVENOUS | Status: DC | PRN
  Administered 2014-04-13: 100 mg via INTRAVENOUS

## 2014-04-13 MED ORDER — CYCLOBENZAPRINE HCL 10 MG PO TABS
ORAL_TABLET | ORAL | Status: AC
  Administered 2014-04-13: 10 mg via ORAL
  Filled 2014-04-13: qty 1

## 2014-04-13 MED ORDER — ACETAMINOPHEN 325 MG PO TABS
650.0000 mg | ORAL_TABLET | Freq: Four times a day (QID) | ORAL | Status: DC | PRN
  Administered 2014-04-13: 650 mg via ORAL
  Filled 2014-04-13: qty 2

## 2014-04-13 MED ORDER — ONDANSETRON HCL 4 MG/2ML IJ SOLN: 4.0000 mg | Freq: Once | INTRAMUSCULAR | Status: DC | PRN

## 2014-04-13 MED ORDER — DEXAMETHASONE SODIUM PHOSPHATE 4 MG/ML IJ SOLN
INTRAMUSCULAR | Status: DC | PRN
  Administered 2014-04-13: 4 mg via INTRAVENOUS

## 2014-04-13 MED ORDER — METOCLOPRAMIDE HCL 5 MG/ML IJ SOLN
5.0000 mg | Freq: Three times a day (TID) | INTRAMUSCULAR | Status: DC | PRN
Start: 2014-04-13 — End: 2014-04-16

## 2014-04-13 MED ORDER — PHENYLEPHRINE HCL 10 MG/ML IJ SOLN
INTRAMUSCULAR | Status: DC | PRN
  Administered 2014-04-13 (×2): 80 ug via INTRAVENOUS
  Administered 2014-04-13 (×2): 160 ug via INTRAVENOUS
  Administered 2014-04-13: 120 ug via INTRAVENOUS
  Administered 2014-04-13: 40 ug via INTRAVENOUS
  Administered 2014-04-13 (×3): 80 ug via INTRAVENOUS
  Administered 2014-04-13: 120 ug via INTRAVENOUS
  Administered 2014-04-13: 40 ug via INTRAVENOUS
  Administered 2014-04-13: 120 ug via INTRAVENOUS

## 2014-04-13 MED ORDER — ACETAMINOPHEN 325 MG PO TABS
ORAL_TABLET | ORAL | Status: AC
  Administered 2014-04-13: 650 mg via ORAL
  Filled 2014-04-13: qty 2

## 2014-04-13 MED ORDER — FLEET ENEMA 7-19 GM/118ML RE ENEM: 1.0000 | ENEMA | Freq: Once | RECTAL | Status: AC | PRN

## 2014-04-13 MED ORDER — 0.9 % SODIUM CHLORIDE (POUR BTL) OPTIME
TOPICAL | Status: DC | PRN
  Administered 2014-04-13: 1000 mL

## 2014-04-13 SURGICAL SUPPLY — 69 items
BANDAGE ELASTIC 4 VELCRO ST LF (GAUZE/BANDAGES/DRESSINGS) ×3 IMPLANT
BANDAGE ELASTIC 6 VELCRO ST LF (GAUZE/BANDAGES/DRESSINGS) ×3 IMPLANT
BANDAGE ESMARK 6X9 LF (GAUZE/BANDAGES/DRESSINGS) ×1 IMPLANT
BLADE SAGITTAL 25.0X1.19X90 (BLADE) ×2 IMPLANT
BLADE SAGITTAL 25.0X1.19X90MM (BLADE) ×1
BLADE SAW SAG 90X13X1.27 (BLADE) ×3 IMPLANT
BNDG ESMARK 6X9 LF (GAUZE/BANDAGES/DRESSINGS) ×3
BOWL SMART MIX CTS (DISPOSABLE) ×3 IMPLANT
CAP KNEE TOTAL 3 SIGMA ×3 IMPLANT
CEMENT HV SMART SET (Cement) ×6 IMPLANT
COVER SURGICAL LIGHT HANDLE (MISCELLANEOUS) ×3 IMPLANT
CUFF TOURNIQUET SINGLE 34IN LL (TOURNIQUET CUFF) ×3 IMPLANT
CUFF TOURNIQUET SINGLE 44IN (TOURNIQUET CUFF) IMPLANT
DRAPE IMP U-DRAPE 54X76 (DRAPES) ×3 IMPLANT
DRAPE INCISE IOBAN 66X45 STRL (DRAPES) IMPLANT
DRAPE ORTHO SPLIT 77X108 STRL (DRAPES) ×4
DRAPE SURG ORHT 6 SPLT 77X108 (DRAPES) ×2 IMPLANT
DRAPE U-SHAPE 47X51 STRL (DRAPES) ×3 IMPLANT
DRSG ADAPTIC 3X8 NADH LF (GAUZE/BANDAGES/DRESSINGS) ×3 IMPLANT
DRSG PAD ABDOMINAL 8X10 ST (GAUZE/BANDAGES/DRESSINGS) ×3 IMPLANT
DURAPREP 26ML APPLICATOR (WOUND CARE) ×3 IMPLANT
ELECT REM PT RETURN 9FT ADLT (ELECTROSURGICAL) ×3
ELECTRODE REM PT RTRN 9FT ADLT (ELECTROSURGICAL) ×1 IMPLANT
EVACUATOR 1/8 PVC DRAIN (DRAIN) ×3 IMPLANT
FACESHIELD WRAPAROUND (MASK) ×6 IMPLANT
FLOSEAL 10ML (HEMOSTASIS) IMPLANT
GAUZE SPONGE 4X4 12PLY STRL (GAUZE/BANDAGES/DRESSINGS) ×3 IMPLANT
GLOVE BIO SURGEON STRL SZ7 (GLOVE) ×3 IMPLANT
GLOVE BIOGEL PI IND STRL 6 (GLOVE) ×1 IMPLANT
GLOVE BIOGEL PI IND STRL 6.5 (GLOVE) ×1 IMPLANT
GLOVE BIOGEL PI IND STRL 8 (GLOVE) ×2 IMPLANT
GLOVE BIOGEL PI INDICATOR 6 (GLOVE) ×2
GLOVE BIOGEL PI INDICATOR 6.5 (GLOVE) ×2
GLOVE BIOGEL PI INDICATOR 8 (GLOVE) ×4
GLOVE ORTHO TXT STRL SZ7.5 (GLOVE) ×6 IMPLANT
GLOVE SURG ORTHO 8.0 STRL STRW (GLOVE) ×6 IMPLANT
GOWN STRL REUS W/ TWL LRG LVL3 (GOWN DISPOSABLE) ×2 IMPLANT
GOWN STRL REUS W/ TWL XL LVL3 (GOWN DISPOSABLE) ×1 IMPLANT
GOWN STRL REUS W/TWL 2XL LVL3 (GOWN DISPOSABLE) ×3 IMPLANT
GOWN STRL REUS W/TWL LRG LVL3 (GOWN DISPOSABLE) ×4
GOWN STRL REUS W/TWL XL LVL3 (GOWN DISPOSABLE) ×2
HANDPIECE INTERPULSE COAX TIP (DISPOSABLE) ×2
HOOD PEEL AWAY FACE SHEILD DIS (HOOD) ×3 IMPLANT
IMMOBILIZER KNEE 22 (SOFTGOODS) ×3 IMPLANT
IMMOBILIZER KNEE 22 UNIV (SOFTGOODS) IMPLANT
KIT BASIN OR (CUSTOM PROCEDURE TRAY) ×3 IMPLANT
KIT ROOM TURNOVER OR (KITS) ×3 IMPLANT
MANIFOLD NEPTUNE II (INSTRUMENTS) ×3 IMPLANT
NEEDLE 22X1 1/2 (OR ONLY) (NEEDLE) IMPLANT
NS IRRIG 1000ML POUR BTL (IV SOLUTION) ×3 IMPLANT
PACK TOTAL JOINT (CUSTOM PROCEDURE TRAY) ×3 IMPLANT
PACK UNIVERSAL I (CUSTOM PROCEDURE TRAY) ×3 IMPLANT
PAD ARMBOARD 7.5X6 YLW CONV (MISCELLANEOUS) ×6 IMPLANT
PAD CAST 4YDX4 CTTN HI CHSV (CAST SUPPLIES) ×1 IMPLANT
PADDING CAST COTTON 4X4 STRL (CAST SUPPLIES) ×2
PADDING CAST COTTON 6X4 STRL (CAST SUPPLIES) ×3 IMPLANT
SET HNDPC FAN SPRY TIP SCT (DISPOSABLE) ×1 IMPLANT
STAPLER VISISTAT 35W (STAPLE) ×3 IMPLANT
SUCTION FRAZIER TIP 10 FR DISP (SUCTIONS) ×3 IMPLANT
SUT ETHIBOND NAB CT1 #1 30IN (SUTURE) ×9 IMPLANT
SUT VIC AB 0 CT1 27 (SUTURE) ×2
SUT VIC AB 0 CT1 27XBRD ANBCTR (SUTURE) ×1 IMPLANT
SUT VIC AB 2-0 CT1 27 (SUTURE) ×4
SUT VIC AB 2-0 CT1 TAPERPNT 27 (SUTURE) ×2 IMPLANT
SYR CONTROL 10ML LL (SYRINGE) IMPLANT
TOWEL OR 17X24 6PK STRL BLUE (TOWEL DISPOSABLE) ×3 IMPLANT
TOWEL OR 17X26 10 PK STRL BLUE (TOWEL DISPOSABLE) ×3 IMPLANT
TRAY FOLEY CATH 16FRSI W/METER (SET/KITS/TRAYS/PACK) IMPLANT
WATER STERILE IRR 1000ML POUR (IV SOLUTION) IMPLANT

## 2014-04-13 NOTE — Plan of Care (Signed)
Problem: Consults Goal: Diagnosis- Total Joint Replacement Primary Total Knee Left     

## 2014-04-13 NOTE — Anesthesia Postprocedure Evaluation (Signed)
Anesthesia Post Note  Patient: Benjamin Sawyer  Procedure(s) Performed: Procedure(s) (LRB): LEFT TOTAL KNEE ARTHROPLASTY (Left)  Anesthesia type: general  Patient location: PACU  Post pain: Pain level controlled  Post assessment: Patient's Cardiovascular Status Stable  Last Vitals:  Filed Vitals:   04/13/14 1508  BP: 120/72  Pulse: 74  Temp: 36.8 C  Resp: 15    Post vital signs: Reviewed and stable  Level of consciousness: sedated  Complications: No apparent anesthesia complications

## 2014-04-13 NOTE — Anesthesia Preprocedure Evaluation (Signed)
Anesthesia Evaluation  Patient identified by MRN, date of birth, ID band Patient awake    Reviewed: Allergy & Precautions, NPO status , Patient's Chart, lab work & pertinent test results  Airway Mallampati: I  TM Distance: >3 FB Neck ROM: Full    Dental   Pulmonary sleep apnea ,          Cardiovascular hypertension, Pt. on medications     Neuro/Psych    GI/Hepatic GERD-  Medicated and Controlled,  Endo/Other    Renal/GU      Musculoskeletal   Abdominal   Peds  Hematology   Anesthesia Other Findings   Reproductive/Obstetrics                             Anesthesia Physical Anesthesia Plan  ASA: III  Anesthesia Plan: General   Post-op Pain Management:    Induction: Intravenous  Airway Management Planned: LMA  Additional Equipment:   Intra-op Plan:   Post-operative Plan: Extubation in OR  Informed Consent: I have reviewed the patients History and Physical, chart, labs and discussed the procedure including the risks, benefits and alternatives for the proposed anesthesia with the patient or authorized representative who has indicated his/her understanding and acceptance.     Plan Discussed with: CRNA and Surgeon  Anesthesia Plan Comments:         Anesthesia Quick Evaluation

## 2014-04-13 NOTE — Progress Notes (Signed)
Orthopedic Tech Progress Note Patient Details:  Benjamin Sawyer 1955-04-13 [id number] CPM applied to LLE with appropriate settings. OHF applied to bed. Footsie roll provided. CPM Left Knee CPM Left Knee: On Left Knee Flexion (Degrees): 90 Left Knee Extension (Degrees): 0   Asia R Thompson 04/13/2014, 10:39 AM

## 2014-04-13 NOTE — Anesthesia Procedure Notes (Addendum)
Anesthesia Regional Block:  Adductor canal block  Pre-Anesthetic Checklist: ,, timeout performed, Correct Patient, Correct Site, Correct Laterality, Correct Procedure, Correct Position, site marked, Risks and benefits discussed,  Surgical consent,  Pre-op evaluation,  At surgeon's request and post-op pain management  Laterality: Left  Prep: chloraprep       Needles:  Injection technique: Single-shot  Needle Type: Echogenic Stimulator Needle     Needle Length: 9cm 9 cm Needle Gauge: 21 and 21 G    Additional Needles:  Procedures: ultrasound guided (picture in chart) Adductor canal block Narrative:  Start time: 04/13/2014 7:15 AM End time: 04/13/2014 7:25 AM Injection made incrementally with aspirations every 5 mL.  Performed by: Personally  Anesthesiologist: Arta BruceSSEY, KEVIN  Additional Notes: Monitors applied. Patient sedated. Sterile prep and drape,hand hygiene and sterile gloves were used. Relevant anatomy identified.Needle position confirmed.Local anesthetic injected incrementally after negative aspiration. Local anesthetic spread visualized around nerve(s). Vascular puncture avoided. No complications. Image printed for medical record.The patient tolerated the procedure well.    Arta BruceKevin Ossey MD   Procedure Name: LMA Insertion Date/Time: 04/13/2014 7:44 AM Performed by: Adonis HousekeeperNGELL, Sayer Masini M Pre-anesthesia Checklist: Patient identified, Emergency Drugs available, Suction available and Patient being monitored Patient Re-evaluated:Patient Re-evaluated prior to inductionOxygen Delivery Method: Circle system utilized Preoxygenation: Pre-oxygenation with 100% oxygen Intubation Type: IV induction Ventilation: Mask ventilation without difficulty LMA: LMA inserted LMA Size: 5.0 Number of attempts: 1 Placement Confirmation: positive ETCO2 and breath sounds checked- equal and bilateral Tube secured with: Tape Dental Injury: Teeth and Oropharynx as per pre-operative assessment

## 2014-04-13 NOTE — Transfer of Care (Signed)
Immediate Anesthesia Transfer of Care Note  Patient: Benjamin CampJackie D Lolli  Procedure(s) Performed: Procedure(s): LEFT TOTAL KNEE ARTHROPLASTY (Left)  Patient Location: PACU  Anesthesia Type:General and Regional  Level of Consciousness: awake, alert , oriented and patient cooperative  Airway & Oxygen Therapy: Patient Spontanous Breathing and Patient connected to nasal cannula oxygen  Post-op Assessment: Report given to RN, Post -op Vital signs reviewed and stable, Patient moving all extremities and Patient moving all extremities X 4  Post vital signs: Reviewed and stable  Last Vitals:  Filed Vitals:   04/13/14 1000  BP: 136/79  Pulse: 85  Temp:   Resp: 19    Complications: No apparent anesthesia complications

## 2014-04-13 NOTE — Brief Op Note (Signed)
04/13/2014  9:54 AM  PATIENT:  Benjamin Sawyer  59 y.o. male  PRE-OPERATIVE DIAGNOSIS:  OA LEFT KNEE  POST-OPERATIVE DIAGNOSIS:  OA LEFT KNEE  PROCEDURE:  Procedure(s): LEFT TOTAL KNEE ARTHROPLASTY (Left)  SURGEON:  Surgeon(s) and Role:    * W D Carloyn Manneraffrey Jr., MD - Primary  PHYSICIAN ASSISTANT: Margart SicklesJoshua Andreia Gandolfi, PA-C  ASSISTANTS:  ANESTHESIA:   regional and general  EBL:  Total I/O In: 1000 [I.V.:1000] Out: 75 [Blood:75]  BLOOD ADMINISTERED:none  DRAINS: 1 hemovac drain lateral left knee self suction   LOCAL MEDICATIONS USED:  NONE  SPECIMEN:  No Specimen  DISPOSITION OF SPECIMEN:  N/A  COUNTS:  YES  TOURNIQUET:   Total Tourniquet Time Documented: Thigh (Left) - 66 minutes Total: Thigh (Left) - 66 minutes   DICTATION: .Other Dictation: Dictation Number unknown  PLAN OF CARE: Admit to inpatient   PATIENT DISPOSITION:  PACU - hemodynamically stable.   Delay start of Pharmacological VTE agent (>24hrs) due to surgical blood loss or risk of bleeding: yes

## 2014-04-13 NOTE — H&P (View-Only) (Signed)
TOTAL KNEE ADMISSION H&P  Patient is being admitted for left total knee arthroplasty.  Subjective:  Chief Complaint:left knee pain.  HPI: Benjamin Sawyer, 59 y.o. male, has a history of pain and functional disability in the left knee due to arthritis and has failed non-surgical conservative treatments for greater than 12 weeks to includeNSAID's and/or analgesics, corticosteriod injections, viscosupplementation injections, use of assistive devices and activity modification.  Onset of symptoms was gradual, starting 10 years ago with gradually worsening course since that time. The patient noted prior procedures on the knee to include  arthroscopy, menisectomy and microfracture on the left knee(s).  Patient currently rates pain in the left knee(s) at 9 out of 10 with activity. Patient has night pain, worsening of pain with activity and weight bearing, pain that interferes with activities of daily living, pain with passive range of motion, crepitus and joint swelling.  Patient has evidence of periarticular osteophytes and joint space narrowing by imaging studies.There is no active infection.  There are no active problems to display for this patient.  Past Medical History  Diagnosis Date  . Kidney stones     Past Surgical History  Procedure Laterality Date  . Knee surgery       (Not in a hospital admission) No Known Allergies  History  Substance Use Topics  . Smoking status: Never Smoker   . Smokeless tobacco: Not on file  . Alcohol Use: No    No family history on file.   Review of Systems  Constitutional: Negative.   HENT: Positive for tinnitus. Negative for congestion, ear discharge, ear pain, hearing loss, nosebleeds and sore throat.   Eyes: Negative.   Respiratory: Negative.  Negative for stridor.   Cardiovascular: Negative.   Gastrointestinal: Positive for diarrhea and constipation. Negative for heartburn, nausea, vomiting, abdominal pain, blood in stool and melena.   Genitourinary: Positive for hematuria. Negative for dysuria, urgency, frequency and flank pain.  Musculoskeletal: Positive for joint pain. Negative for falls.  Skin: Negative.   Neurological: Negative.  Negative for headaches.  Endo/Heme/Allergies: Negative.   Psychiatric/Behavioral: Negative.     Objective:  Physical Exam  Constitutional: He is oriented to person, place, and time. He appears well-developed and well-nourished. No distress.  HENT:  Head: Normocephalic and atraumatic.  Nose: Nose normal.  Eyes: Conjunctivae and EOM are normal. Pupils are equal, round, and reactive to light.  Neck: Normal range of motion. Neck supple.  Cardiovascular: Normal rate, regular rhythm, normal heart sounds and intact distal pulses.   Respiratory: Effort normal and breath sounds normal. No respiratory distress. He has no wheezes. He exhibits no tenderness.  GI: Soft. Bowel sounds are normal. He exhibits no distension. There is no tenderness.  Musculoskeletal:       Left knee: He exhibits swelling and bony tenderness. He exhibits normal range of motion, no erythema, no LCL laxity and no MCL laxity. Tenderness found.  Lymphadenopathy:    He has no cervical adenopathy.  Neurological: He is alert and oriented to person, place, and time. No cranial nerve deficit.  Skin: Skin is warm and dry. No rash noted. No erythema. No pallor.  Psychiatric: He has a normal mood and affect. His behavior is normal.    Vital signs in last 24 hours: @VSRANGES@  Labs:   Estimated body mass index is 33.90 kg/(m^2) as calculated from the following:   Height as of 08/28/12: 6' (1.829 m).   Weight as of 08/28/12: 113.399 kg (250 lb).   Imaging Review   Plain radiographs demonstrate moderate degenerative joint disease of the left knee(s). The overall alignment ismild varus. The bone quality appears to be good for age and reported activity level.  Assessment/Plan:  End stage arthritis, left knee   The patient  history, physical examination, clinical judgment of the provider and imaging studies are consistent with end stage degenerative joint disease of the left knee(s) and total knee arthroplasty is deemed medically necessary. The treatment options including medical management, injection therapy arthroscopy and arthroplasty were discussed at length. The risks and benefits of total knee arthroplasty were presented and reviewed. The risks due to aseptic loosening, infection, stiffness, patella tracking problems, thromboembolic complications and other imponderables were discussed. The patient acknowledged the explanation, agreed to proceed with the plan and consent was signed. Patient is being admitted for inpatient treatment for surgery, pain control, PT, OT, prophylactic antibiotics, VTE prophylaxis, progressive ambulation and ADL's and discharge planning. The patient is planning to be discharged home with home health services   

## 2014-04-13 NOTE — Discharge Instructions (Signed)
Diet: As you were doing prior to hospitalization   Activity: Increase activity slowly as tolerated  No lifting or driving for 6 weeks   Shower: May shower without a dressing on post op day #5, NO SOAKING in tub   Dressing: You may change your dressing on post op day #3.  Then change the dressing daily with sterile 4"x4"s gauze dressing  And TED hose for knees. Use paper tape to hold dressing in place  For hips. You may clean the incision with alcohol prior to redressing.   Weight Bearing: weight bearing as taught in physical therapy. Use a walker or  Crutches as instructed.   To prevent constipation: you may use a stool softener such as -  Colace ( over the counter) 100 mg by mouth twice a day  Drink plenty of fluids ( prune juice may be helpful) and high fiber foods  Miralax ( over the counter) for constipation as needed.   Precautions: If you experience chest pain or shortness of breath - call 911 immediately For transfer to the hospital emergency department!!  If you develop a fever greater that 101 F, purulent drainage from wound, increased redness or drainage from wound, or calf pain -- Call the office   Follow- Up Appointment: Please call for an appointment to be seen in 2 weeks  [place] [phone]  Information on my medicine - ELIQUIS (apixaban)  This medication education was reviewed with me or my healthcare representative as part of my discharge preparation.  The pharmacist that spoke with me during my hospital stay was:  Clyda HurdleJeremy Thurmon Mizell, PharmD  Why was Eliquis prescribed for you? Eliquis was prescribed for you to reduce the risk of blood clots forming after orthopedic surgery.    What do You need to know about Eliquis? Take your Eliquis 2.5 mg TWICE DAILY - one tablet in the morning and one tablet in the evening with or without food.  It would be best to take the dose about the same time each day.  If you have difficulty swallowing the tablet whole please discuss  with your pharmacist how to take the medication safely.  Take Eliquis exactly as prescribed by your doctor and DO NOT stop taking Eliquis without talking to the doctor who prescribed the medication.  Stopping without other medication to take the place of Eliquis may increase your risk of developing a clot.  After discharge, you should have regular check-up appointments with your healthcare provider that is prescribing your Eliquis.  What do you do if you miss a dose? If a dose of ELIQUIS is not taken at the scheduled time, take it as soon as possible on the same day and twice-daily administration should be resumed.  The dose should not be doubled to make up for a missed dose.  Do not take more than one tablet of ELIQUIS at the same time.  Important Safety Information A possible side effect of Eliquis is bleeding. You should call your healthcare provider right away if you experience any of the following: ? Bleeding from an injury or your nose that does not stop. ? Unusual colored urine (red or dark brown) or unusual colored stools (red or black). ? Unusual bruising for unknown reasons. ? A serious fall or if you hit your head (even if there is no bleeding).  Some medicines may interact with Eliquis and might increase your risk of bleeding or clotting while on Eliquis. To help avoid this, consult your healthcare provider or pharmacist  prior to using any new prescription or non-prescription medications, including herbals, vitamins, non-steroidal anti-inflammatory drugs (NSAIDs) and supplements.  This website has more information on Eliquis (apixaban): http://www.eliquis.com/eliquis/home

## 2014-04-13 NOTE — Interval H&P Note (Signed)
History and Physical Interval Note:  04/13/2014 7:24 AM  Benjamin Sawyer  has presented today for surgery, with the diagnosis of OA LEFT KNEE  The various methods of treatment have been discussed with the patient and family. After consideration of risks, benefits and other options for treatment, the patient has consented to  Procedure(s): LEFT TOTAL KNEE ARTHROPLASTY (Left) as a surgical intervention .  The patient's history has been reviewed, patient examined, no change in status, stable for surgery.  I have reviewed the patient's chart and labs.  Questions were answered to the patient's satisfaction.     England Greb JR,W D

## 2014-04-13 NOTE — Evaluation (Signed)
Physical Therapy Evaluation Patient Details Name: Benjamin Sawyer MRN: [id number] DOB: 1955/11/14 Today's Date: 04/13/2014   History of Present Illness  Patient is a 59 yo male admitted 04/13/14 now s/p Lt TKA.  PMH:  arthritis  Clinical Impression  Patient presents with problems listed below.  Will benefit from acute PT to maximize independence prior to discharge home with wife.    Follow Up Recommendations Home health PT;Supervision/Assistance - 24 hour    Equipment Recommendations  None recommended by PT    Recommendations for Other Services       Precautions / Restrictions Precautions Precautions: Knee Precaution Booklet Issued: Yes (comment) Precaution Comments: Reviewed precautions with patient and wife. Required Braces or Orthoses: Knee Immobilizer - Left Knee Immobilizer - Left: On when out of bed or walking Restrictions Weight Bearing Restrictions: Yes LLE Weight Bearing: Weight bearing as tolerated      Mobility  Bed Mobility Overal bed mobility: Needs Assistance Bed Mobility: Supine to Sit     Supine to sit: Min assist     General bed mobility comments: Removed CPM.  Instructed patient on donning KI on LLE.  Assist to move LLE off of bed.  Patient sat EOB x 7 minutes - unable to void.  Requested to go into bathroom.  Transfers Overall transfer level: Needs assistance   Transfers: Sit to/from Stand Sit to Stand: Min assist;From elevated surface         General transfer comment: Verbal cues for hand placement and technique.  Assist to rise to standing from elevated bed and from toilet.    Ambulation/Gait Ambulation/Gait assistance: Min assist Ambulation Distance (Feet): 24 Feet Assistive device: Rolling walker (2 wheeled) Gait Pattern/deviations: Step-to pattern;Decreased stance time - left;Decreased step length - right;Decreased step length - left;Decreased weight shift to left;Antalgic Gait velocity: Decreased Gait velocity interpretation: Below  normal speed for age/gender General Gait Details: Verbal cues for safe use of RW and gait sequence.  Assist for balance/safety.  Stairs            Wheelchair Mobility    Modified Rankin (Stroke Patients Only)       Balance                                             Pertinent Vitals/Pain Pain Assessment: 0-10 Pain Score: 2  Pain Location: Lt knee Pain Descriptors / Indicators: Sore Pain Intervention(s): Monitored during session;Repositioned;Ice applied    Home Living Family/patient expects to be discharged to:: Private residence Living Arrangements: Spouse/significant other Available Help at Discharge: Family;Available 24 hours/day (for 1 week) Type of Home: House Home Access: Stairs to enter Entrance Stairs-Rails: Doctor, general practice of Steps: 7 Home Layout: One level Home Equipment: Walker - 2 wheels;Bedside commode      Prior Function Level of Independence: Independent               Hand Dominance        Extremity/Trunk Assessment   Upper Extremity Assessment: Overall WFL for tasks assessed           Lower Extremity Assessment: LLE deficits/detail   LLE Deficits / Details: Decreased strength and ROM post-op.  Was able to assist with moving LLE off of bed.  Cervical / Trunk Assessment: Normal  Communication   Communication: No difficulties  Cognition Arousal/Alertness: Awake/alert Behavior During Therapy: WFL for tasks assessed/performed Overall Cognitive  Status: Within Functional Limits for tasks assessed                      General Comments      Exercises Total Joint Exercises Ankle Circles/Pumps: AROM;Both;10 reps;Seated      Assessment/Plan    PT Assessment Patient needs continued PT services  PT Diagnosis Difficulty walking;Acute pain   PT Problem List Decreased strength;Decreased range of motion;Decreased activity tolerance;Decreased balance;Decreased mobility;Decreased knowledge  of use of DME;Decreased knowledge of precautions;Pain  PT Treatment Interventions DME instruction;Gait training;Stair training;Functional mobility training;Therapeutic activities;Therapeutic exercise;Patient/family education   PT Goals (Current goals can be found in the Care Plan section) Acute Rehab PT Goals Patient Stated Goal: To return home soon PT Goal Formulation: With patient/family Time For Goal Achievement: 04/20/14 Potential to Achieve Goals: Good    Frequency 7X/week   Barriers to discharge        Co-evaluation               End of Session Equipment Utilized During Treatment: Gait belt;Left knee immobilizer Activity Tolerance: Patient tolerated treatment well Patient left: in chair;with call bell/phone within reach;with family/visitor present Nurse Communication: Mobility status         Time: 4132-44011724-1752 PT Time Calculation (min) (ACUTE ONLY): 28 min   Charges:   PT Evaluation $Initial PT Evaluation Tier I: 1 Procedure PT Treatments $Gait Training: 8-22 mins   PT G CodesVena Sawyer:        Benjamin Benjamin Sawyer Benjamin Sawyer, PT, [facility] Acute Rehab Services Pager [phone]

## 2014-04-14 ENCOUNTER — Encounter (HOSPITAL_COMMUNITY): Payer: Self-pay | Admitting: Orthopedic Surgery

## 2014-04-14 LAB — BASIC METABOLIC PANEL
Anion gap: 3 — ABNORMAL LOW (ref 5–15)
BUN: 7 mg/dL (ref 6–23)
CHLORIDE: 105 mmol/L (ref 96–112)
CO2: 31 mmol/L (ref 19–32)
Calcium: 8.2 mg/dL — ABNORMAL LOW (ref 8.4–10.5)
Creatinine, Ser: 0.91 mg/dL (ref 0.50–1.35)
GFR calc non Af Amer: >90 mL/min (ref >90)
Glucose, Bld: 151 mg/dL — ABNORMAL HIGH (ref 70–99)
POTASSIUM: 3.7 mmol/L (ref 3.5–5.1)
Sodium: 139 mmol/L (ref 135–145)

## 2014-04-14 LAB — CBC
HCT: 36.4 % — ABNORMAL LOW (ref 39.0–52.0)
Hemoglobin: 12.6 g/dL — ABNORMAL LOW (ref 13.0–17.0)
MCH: 31.5 pg (ref 26.0–34.0)
MCHC: 34.6 g/dL (ref 30.0–36.0)
MCV: 91 fL (ref 78.0–100.0)
Platelets: 166 10*3/uL (ref 150–400)
RBC: 4 MIL/uL — AB (ref 4.22–5.81)
RDW: 12.9 % (ref 11.5–15.5)
WBC: 8.2 10*3/uL (ref 4.0–10.5)

## 2014-04-14 MED ORDER — CETYLPYRIDINIUM CHLORIDE 0.05 % MT LIQD
7.0000 mL | Freq: Two times a day (BID) | OROMUCOSAL | Status: DC
  Administered 2014-04-14 – 2014-04-15 (×3): 7 mL via OROMUCOSAL

## 2014-04-14 NOTE — Progress Notes (Signed)
Patient already administered CPAP. Patient is tolerating well at this time and is sleeping . RT will continue to monitor as needed.

## 2014-04-14 NOTE — Care Management Note (Addendum)
CARE MANAGEMENT NOTE 04/14/2014  Patient:  Benjamin Sawyer, Benjamin Sawyer   Account Number:  [id number]  Date Initiated:  04/14/2014  Documentation initiated by:  Oliveras-Aizpurua,Elliot Meldrum  Subjective/Objective Assessment:   59 yo Male underwent L TKA.     Action/Plan:   HHPT and DME   Anticipated DC Date:  04/15/2014   Anticipated DC Plan:  [place]  CM consult      [facility] Choice  HOME HEALTH   Choice offered to / List presented to: NA        HH arranged  HH-2 PT      [place]   Status of service:  Completed, signed off Medicare Important Message given?   (If response is "NO", the following Medicare IM given date fields will be blank) Date Medicare IM given:   Medicare IM given by:   Date Additional Medicare IM given:   Additional Medicare IM given by:    Discharge Disposition:    Per UR Regulation:    If discussed at Long Length of Stay Meetings, dates discussed:    Comments:  04/14/2014 1550 - Frann Rider, RN, BSN  Met with pt and wife. Discharge plan is to return home with the support of his wife. Wife stated that the RW, 3 in 1 BSC and CPM were delivered. HHPT has been arranged with [facility] prior to surgery. They agree to use [facility].

## 2014-04-14 NOTE — Progress Notes (Signed)
Physical Therapy Treatment Patient Details Name: Benjamin Sawyer MRN: [id number] DOB: [birth year] Today's Date: 04/14/2014    History of Present Illness Patient is a 59 yo male admitted 04/13/14 now s/p Lt TKA.  PMH:  arthritis    PT Comments    Pt cont's to move well but reports increased pain this PM.  Pt hopeful to d/c home tomorrow.  Needs to practice steps before d/c home.     Follow Up Recommendations  Home health PT;Supervision/Assistance - 24 hour     Equipment Recommendations  None recommended by PT    Recommendations for Other Services       Precautions / Restrictions Precautions Precautions: Knee Precaution Comments: Reviewed precautions with patient and wife. Required Braces or Orthoses: Knee Immobilizer - Left Knee Immobilizer - Left: On when out of bed or walking Restrictions Weight Bearing Restrictions: Yes LLE Weight Bearing: Weight bearing as tolerated    Mobility  Bed Mobility Overal bed mobility: Modified Independent Bed Mobility: Supine to Sit;Sit to Supine     Supine to sit: Min assist Sit to supine: Min assist   General bed mobility comments: light min to advance LLE with pt also using RLE to assist LLE  Transfers Overall transfer level: Needs assistance Equipment used: Rolling walker (2 wheeled) Transfers: Sit to/from Stand Sit to Stand: Min guard         General transfer comment: cues to reinforce hand placement  Ambulation/Gait Ambulation/Gait assistance: Min guard Ambulation Distance (Feet): 100 Feet Assistive device: Rolling walker (2 wheeled) Gait Pattern/deviations: Step-through pattern;Decreased step length - right;Decreased step length - left;Decreased weight shift to left Gait velocity: Decreased   General Gait Details: Pt reports increased pain this PM resulting in decreased WBing tolerance LLE & decreased step/stride length.     Stairs            Wheelchair Mobility    Modified Rankin (Stroke Patients Only)        Balance Overall balance assessment: Needs assistance         Standing balance support: Bilateral upper extremity supported;During functional activity Standing balance-Leahy Scale: Poor Standing balance comment: needs support for balance                    Cognition Arousal/Alertness: Awake/alert Behavior During Therapy: WFL for tasks assessed/performed Overall Cognitive Status: Within Functional Limits for tasks assessed                      Exercises Total Joint Exercises Ankle Circles/Pumps: AROM;Both;10 reps Quad Sets: AROM;Both;10 reps Gluteal Sets: AROM;Both;10 reps Hip ABduction/ADduction: AAROM;Strengthening;Left;10 reps Straight Leg Raises: AAROM;Strengthening;Left;10 reps Goniometric ROM: Lt knee flexion 70 degrees    General Comments        Pertinent Vitals/Pain Pain Assessment: 0-10 Pain Score: 2  Pain Location: Lt knee Pain Descriptors / Indicators: Sore Pain Intervention(s): Premedicated before session;Repositioned    Home Living Family/patient expects to be discharged to:: Private residence Living Arrangements: Spouse/significant other Available Help at Discharge: Family;Available 24 hours/day Type of Home: House Home Access: Stairs to enter Entrance Stairs-Rails: Right;Left Home Layout: One level Home Equipment: Environmental consultant - 2 wheels;Bedside commode      Prior Function Level of Independence: Independent          PT Goals (current goals can now be found in the care plan section) Acute Rehab PT Goals Patient Stated Goal: play golf by April PT Goal Formulation: With patient/family Time For Goal Achievement: 04/20/14 Potential to Achieve  Goals: Good Progress towards PT goals: Progressing toward goals    Frequency  7X/week    PT Plan Current plan remains appropriate    Co-evaluation             End of Session Equipment Utilized During Treatment: Gait belt;Left knee immobilizer Activity Tolerance: Patient tolerated  treatment well Patient left: in bed;in CPM;with call bell/phone within reach;with family/visitor present     Time: [id number] PT Time Calculation (min) (ACUTE ONLY): 21 min  Charges:  $Gait Training: 8-22 mins $Therapeutic Exercise: 8-22 mins                    G Codes:      [name], Jakaila Norment Lynn 04/14/2014, 2:36 PM   Verdell FaceKelly Dareon Nunziato, [name] [phone] 04/14/2014

## 2014-04-14 NOTE — Progress Notes (Signed)
Physical Therapy Treatment Patient Details Name: Benjamin Sawyer MRN: [id number] DOB: [birth year] Today's Date: 04/14/2014    History of Present Illness Patient is a 59 yo male admitted 04/13/14 now s/p Lt TKA.  PMH:  arthritis    PT Comments    Pt progressing well with mobility/PT goals.  Cont with POC.    Follow Up Recommendations  Home health PT;Supervision/Assistance - 24 hour     Equipment Recommendations  None recommended by PT    Recommendations for Other Services       Precautions / Restrictions Precautions Precautions: Knee Precaution Comments: Reviewed precautions with patient and wife. Required Braces or Orthoses: Knee Immobilizer - Left Knee Immobilizer - Left: On when out of bed or walking Restrictions Weight Bearing Restrictions: Yes LLE Weight Bearing: Weight bearing as tolerated    Mobility  Bed Mobility Overal bed mobility: Needs Assistance Bed Mobility: Supine to Sit     Supine to sit: Min assist     General bed mobility comments: (A) for LLE management  Transfers Overall transfer level: Needs assistance Equipment used: Rolling walker (2 wheeled) Transfers: Sit to/from Stand Sit to Stand: Min guard         General transfer comment: cues to reinforce hand placement  Ambulation/Gait Ambulation/Gait assistance: Min guard Ambulation Distance (Feet): 140 Feet Assistive device: Rolling walker (2 wheeled) Gait Pattern/deviations: Step-through pattern;Decreased stride length     General Gait Details: Pt progressing with fluidity of gait.  Encouragement to increase LLE WBing during stance phase   Stairs            Wheelchair Mobility    Modified Rankin (Stroke Patients Only)       Balance                                    Cognition Arousal/Alertness: Awake/alert Behavior During Therapy: WFL for tasks assessed/performed Overall Cognitive Status: Within Functional Limits for tasks assessed                      Exercises Total Joint Exercises Ankle Circles/Pumps: AROM;Both;10 reps Quad Sets: AROM;Both;10 reps Gluteal Sets: AROM;Both;10 reps Hip ABduction/ADduction: AAROM;Strengthening;Left;10 reps Straight Leg Raises: AAROM;Strengthening;Left;10 reps    General Comments        Pertinent Vitals/Pain Pain Assessment: 0-10 Pain Score: 3  Pain Location: Lt knee Pain Descriptors / Indicators: Discomfort Pain Intervention(s): Monitored during session;Repositioned;Premedicated before session    Home Living                      Prior Function            PT Goals (current goals can now be found in the care plan section) Acute Rehab PT Goals Patient Stated Goal: To return home soon PT Goal Formulation: With patient/family Time For Goal Achievement: 04/20/14 Potential to Achieve Goals: Good Progress towards PT goals: Progressing toward goals    Frequency  7X/week    PT Plan Current plan remains appropriate    Co-evaluation             End of Session Equipment Utilized During Treatment: Gait belt;Left knee immobilizer Activity Tolerance: Patient tolerated treatment well Patient left: in chair;with call bell/phone within reach     Time: 0925-0950 PT Time Calculation (min) (ACUTE ONLY): 25 min  Charges:  $Gait Training: 8-22 mins $Therapeutic Exercise: 8-22 mins  G Codes:      [name], [name] 04/14/2014, 12:22 PM   [name], [name] [phone] 04/14/2014

## 2014-04-14 NOTE — Op Note (Signed)
NAME:  Donavan FoilGODDARD, Malikah              ACCOUNT NO.:  [id number]  MEDICAL RECORD NO.:  [id number]  LOCATION:  [id number]                        FACILITY:  [facility]  PHYSICIAN:  Dyke BrackettW. D. Eshan Trupiano, M.D.    DATE OF BIRTH:  1955-05-26  DATE OF PROCEDURE: DATE OF DISCHARGE:                              OPERATIVE REPORT   INDICATIONS:  A 59 year old with intractable knee pain on the left side. Previous arthroscopic surgery including microfracture, thought to be amenable to left total knee replacement.  PREOPERATIVE DIAGNOSIS:  Osteoarthritis, left knee.  POSTOPERATIVE DIAGNOSIS:  Osteoarthritis, left knee.  OPERATION:  Left total knee replacement (Sigma cemented knee size 5 femur and tibia, 39 mm all-poly patella with 10 mm bearing).  SURGEON:  Dyke BrackettW. D. Deontaye Civello, M.D.  ASSISTANT:  Margart SicklesJoshua Chadwell, PA-C.  TOURNIQUET TIME:  66 minutes.  DESCRIPTION OF PROCEDURE:  General anesthetic with femoral nerve block. Straight skin incision with medial parapatellar approach made to the knee, cut 11 mm off the distal femur and then cut about 3-4 mm below the most diseased medial compartment.  Extension gap was measured at 10 mm. We sized the femur to be a size 5 followed by placement of the anterior, posterior, and chamfer cutting block on the femur, made those cuts. Flexion gap equal to the extension gap at 10 mm.  Keel hole was cut for the tibia followed by trial reduction.  We noted on the reduction, there was a slight lack of full extension and then the flexion gap was somewhat tight.  Therefore, we elected to cut 2 more mm of tibia, thus increasing the flexion and extension gap equally and the range of motion at that point was evaluated with trials.  We noted no tendency for bearing spin out and then full extension was obtained.  Patella was cut with sacrifice of about 9 mm of patella for a 38 mm all-poly trial, trials were placed.  Full extension was noted at the table.  Good resolution of the mild varus  deformity of the knee was noted, bearing was very stable with no tendency for bearing spin out, and the ligaments were balanced.  Trials were removed.  The bony surfaces were irrigated.  We then placed the final components with cement in a doughy state, tibia followed by femur and patella.  We elected to use a trial bearing with the final components.  The cement was allowed to harden.  Excess cement was removed from the posterior aspect of the knee when the trial bearing was removed.  Tourniquet was released.  Small bleeders were coagulated. Hemovac drain was placed superolaterally.  Final poly was placed with a 10 mm poly, again all parameters deemed to be acceptable.  Closure was affected with #1 Ethibond, 2-0 Vicryl, and skin clips, taken to recovery room in stable condition.  Lightly compressive sterile dressing, knee immobilizer applied.     Dyke BrackettW. D. Analea Muller, M.D.     WDC/MEDQ  D:  04/13/2014  T:  04/14/2014  Job:  [id number]

## 2014-04-14 NOTE — Evaluation (Signed)
Occupational Therapy Evaluation Patient Details Name: Benjamin CampJackie D Wessner MRN: [id number] DOB: 08/20/1955 Today's Date: 04/14/2014    History of Present Illness Patient is a 59 yo male admitted 04/13/14 now s/p Lt TKA.  PMH:  arthritis   Clinical Impression   Pt admitted with the above diagnoses and presents with below problem list. Pt will benefit from continued acute OT to address the below listed deficits and maximize independence with basic ADLs prior to d/c home with family. PTA pt was independent with ADLs. Pt currently at min A level for LB ADLs and transfers for balance.       Follow Up Recommendations  Supervision/Assistance - 24 hour;No OT follow up    Equipment Recommendations  None recommended by OT    Recommendations for Other Services       Precautions / Restrictions Precautions Precautions: Knee Precaution Comments: Reviewed precautions with patient and wife. Required Braces or Orthoses: Knee Immobilizer - Left Knee Immobilizer - Left: On when out of bed or walking Restrictions Weight Bearing Restrictions: Yes LLE Weight Bearing: Weight bearing as tolerated      Mobility Bed Mobility Overal bed mobility: Needs Assistance Bed Mobility: Supine to Sit;Sit to Supine     Supine to sit: Min assist Sit to supine: Min assist   General bed mobility comments: light min to advance LLE with pt also using RLE to assist LLE  Transfers Overall transfer level: Needs assistance Equipment used: Rolling walker (2 wheeled) Transfers: Sit to/from Stand Sit to Stand: Min assist         General transfer comment: light min [facility] transfer    Balance Overall balance assessment: Needs assistance         Standing balance support: Bilateral upper extremity supported;During functional activity Standing balance-Leahy Scale: Poor Standing balance comment: needs support for balance                            ADL Overall ADL's :  Needs assistance/impaired Eating/Feeding: Set up;Sitting   Grooming: Set up;Sitting   Upper Body Bathing: Set up;Sitting   Lower Body Bathing: Minimal assistance;Sit to/from stand   Upper Body Dressing : Set up;Sitting   Lower Body Dressing: Minimal assistance;Sit to/from stand   Toilet Transfer: Minimal assistance;Ambulation;RW;BSC   Toileting- Clothing Manipulation and Hygiene: Minimal assistance;Sit to/from stand   Tub/ Shower Transfer: Minimal assistance;Ambulation;3 in 1;Rolling walker   Functional mobility during ADLs: Min guard;Rolling walker General ADL Comments: light Min A for LB ADLs for balance in sit>stand. Recommended to pt and spouse having someone with him for shower/toilet transfers/LB dressing. Edcuated on techniques and AE for safe completino of ADLs with knee precuations. Pt's spouse will be with him 24 hours the first week home.     Vision     Perception     Praxis      Pertinent Vitals/Pain Pain Assessment: 0-10 Pain Score: 5  Pain Location: Lt knee Pain Descriptors / Indicators: Sore Pain Intervention(s): Monitored during session;Repositioned     Hand Dominance     Extremity/Trunk Assessment Upper Extremity Assessment Upper Extremity Assessment: Overall WFL for tasks assessed   Lower Extremity Assessment Lower Extremity Assessment: Defer to PT evaluation   Cervical / Trunk Assessment Cervical / Trunk Assessment: Normal   Communication Communication Communication: No difficulties   Cognition Arousal/Alertness: Awake/alert Behavior During Therapy: WFL for tasks assessed/performed Overall Cognitive Status: Within Functional Limits for tasks assessed  General Comments       Exercises      Shoulder Instructions      Home Living Family/patient expects to be discharged to:: Private residence Living Arrangements: Spouse/significant other Available Help at Discharge: Family;Available 24 hours/day Type of  Home: House Home Access: Stairs to enter [employer] of Steps: 7 Entrance Stairs-Rails: Right;Left Home Layout: One level     Bathroom Shower/Tub: Walk-in shower;Tub/shower unit   Bathroom Toilet: Standard Bathroom Accessibility: Yes How Accessible: Accessible via walker Home Equipment: Walker - 2 wheels;Bedside commode          Prior Functioning/Environment Level of Independence: Independent             OT Diagnosis: Acute pain   OT Problem List: Impaired balance (sitting and/or standing);Decreased knowledge of use of DME or AE;Decreased knowledge of precautions;Pain   OT Treatment/Interventions: Self-care/ADL training;DME and/or AE instruction;Therapeutic activities;Patient/family education;Balance training    OT Goals(Current goals can be found in the care plan section) Acute Rehab OT Goals Patient Stated Goal: play golf by April OT Goal Formulation: With patient/family Time For Goal Achievement: 04/21/14 Potential to Achieve Goals: Good ADL Goals Pt Will Perform Lower Body Bathing: with adaptive equipment;sit to/from stand;with supervision Pt Will Perform Lower Body Dressing: with modified independence;with adaptive equipment;sit to/from stand Pt Will Transfer to Toilet: with modified independence;ambulating (3n1 over toilet) Pt Will Perform Toileting - Clothing Manipulation and hygiene: with modified independence;sit to/from stand Additional ADL Goal #1: Pt will complete shower transfer simulating home environment at supervision level.  OT Frequency: Min 3X/week   Barriers to D/C:            Co-evaluation              End of Session Equipment Utilized During Treatment: Gait belt;Rolling walker;Left knee immobilizer CPM Left Knee Additional Comments: zero knee on Nurse Communication: Other (comment) (bleeding around site of drain that had been removed)  Activity Tolerance: Patient tolerated treatment well Patient left: in bed;with call  bell/phone within reach;with family/visitor present   Time: 9147-8295 OT Time Calculation (min): 30 min Charges:  OT General Charges $OT Visit: 1 Procedure OT Evaluation $Initial OT Evaluation Tier I: 1 Procedure OT Treatments $Self Care/Home Management : 8-22 mins G-Codes:    Pilar Grammes 15-Apr-2014, 12:44 PM

## 2014-04-14 NOTE — Progress Notes (Signed)
SPORTS MEDICINE AND JOINT REPLACEMENT  Georgena Spurling, MD   Altamese Cabal, PA-C [address] [place], [place], [place]  [address]                             [phone]   PROGRESS NOTE  Subjective:  negative for Chest Pain  negative for Shortness of Breath  negative for Nausea/Vomiting   negative for Calf Pain  negative for Bowel Movement   Tolerating Diet: yes         Patient reports pain as 4 on 0-10 scale.    Objective: Vital signs in last 24 hours:   Patient Vitals for the past 24 hrs:  BP Temp Temp src Pulse Resp SpO2  04/14/14 0753 - - - - 16 -  04/14/14 0423 (!) 107/57 mmHg 98.4 F (36.9 C) Oral 70 16 95 %  04/14/14 0400 - - - - 16 95 %  04/14/14 0017 109/62 mmHg 98.8 F (37.1 C) Oral 67 16 99 %  04/14/14 0000 - - - - 16 95 %  04/13/14 2147 - - - 70 16 -  04/13/14 2017 104/66 mmHg 98.1 F (36.7 C) Oral 71 16 99 %  04/13/14 2000 - - - - 16 95 %  04/13/14 1529 - - - - 15 -  04/13/14 1508 120/72 mmHg 98.2 F (36.8 C) - 74 15 96 %  04/13/14 1445 - 98.2 F (36.8 C) - - - -  04/13/14 1415 - - - 86 14 97 %  04/13/14 1400 - - - - - 99 %  04/13/14 1315 115/66 mmHg - - 76 11 97 %  04/13/14 1245 - - - 88 13 97 %  04/13/14 1230 - - - 74 13 99 %  04/13/14 1215 132/77 mmHg - - 85 14 100 %  04/13/14 1115 126/72 mmHg - - 83 14 99 %  04/13/14 1100 122/67 mmHg 98.1 F (36.7 C) - 76 13 96 %  04/13/14 1045 (!) 144/69 mmHg - - 71 15 97 %  04/13/14 1030 130/76 mmHg - - 79 17 97 %  04/13/14 1015 133/81 mmHg - - 79 19 99 %  04/13/14 1000 136/79 mmHg - - 85 19 96 %  04/13/14 0956 - 97.9 F (36.6 C) - 91 16 -    {1959:LAST@   Intake/Output from previous day:   02/19 0701 - 02/20 0700 In: 2700 [P.O.:900; I.V.:1700] Out: 1325 [Urine:450; Drains:800]   Intake/Output this shift:       Intake/Output      02/19 0701 - 02/20 0700 02/20 0701 - 02/21 0700   P.O. 900    I.V. (mL/kg) 1700 (14.7)    IV Piggyback 100    Total Intake(mL/kg) 2700 (23.3)    Urine (mL/kg/hr)  450 (0.2)    Drains 800 (0.3)    Blood 75 (0)    Total Output 1325     Net +1375          Urine Occurrence 4 x       LABORATORY DATA:  Recent Labs  04/14/14 0620  WBC 8.2  HGB 12.6*  HCT 36.4*  PLT 166    Recent Labs  04/14/14 0620  NA 139  K 3.7  CL 105  CO2 31  BUN 7  CREATININE 0.91  GLUCOSE 151*  CALCIUM 8.2*   Lab Results  Component Value Date   INR 1.01 04/03/2014    Examination:  General  appearance: alert, cooperative and no distress Extremities: Homans sign is negative, no sign of DVT  Wound Exam: clean, dry, intact   Drainage:  Moderate amount Serosanguinous exudate  Motor Exam: EHL and FHL Intact  Sensory Exam: Deep Peroneal normal   Assessment:    1 Day Post-Op  Procedure(s) (LRB): LEFT TOTAL KNEE ARTHROPLASTY (Left)  ADDITIONAL DIAGNOSIS:  Active Problems:   Osteoarthritis of left knee  Acute Blood Loss Anemia   Plan: Physical Therapy as ordered Weight Bearing as Tolerated (WBAT)    DISCHARGE PLAN: Home sun or monday          Akeem Heppler 04/14/2014, 9:50 AM

## 2014-04-15 LAB — CBC
HCT: 36 % — ABNORMAL LOW (ref 39.0–52.0)
Hemoglobin: 12.5 g/dL — ABNORMAL LOW (ref 13.0–17.0)
MCH: 31.8 pg (ref 26.0–34.0)
MCHC: 34.7 g/dL (ref 30.0–36.0)
MCV: 91.6 fL (ref 78.0–100.0)
PLATELETS: 166 10*3/uL (ref 150–400)
RBC: 3.93 MIL/uL — ABNORMAL LOW (ref 4.22–5.81)
RDW: 12.6 % (ref 11.5–15.5)
WBC: 8.2 10*3/uL (ref 4.0–10.5)

## 2014-04-15 NOTE — Progress Notes (Signed)
Utilization review completed.  

## 2014-04-15 NOTE — Progress Notes (Addendum)
Physical Therapy Treatment Patient Details Name: Benjamin Sawyer MRN: [id number] DOB: [birth year] Today's Date: 04/15/2014    History of Present Illness Patient is a 59 yo male admitted 04/13/14 now s/p Lt TKA.  PMH:  arthritis    PT Comments    Pt cont's to move well but reports he feels "weak" & "lifeless" today.  Unable to tolerate increased ambulation distance but was able to initiate & complete stair training this session.  Pt states he's unsure if he feels comfortable discharging home today due to feeling weaker.      Follow Up Recommendations  Home health PT;Supervision/Assistance - 24 hour     Equipment Recommendations  None recommended by PT    Recommendations for Other Services       Precautions / Restrictions Precautions Precautions: Knee Precaution Comments: Reviewed precautions with patient and wife. Required Braces or Orthoses: Knee Immobilizer - Left Knee Immobilizer - Left: On when out of bed or walking Restrictions Weight Bearing Restrictions: Yes LLE Weight Bearing: Weight bearing as tolerated    Mobility  Bed Mobility Overal bed mobility: Needs Assistance Bed Mobility: Supine to Sit     Supine to sit: Min assist     General bed mobility comments: Light min assist to support LLE when scooting hips to EOB  Transfers Overall transfer level: Needs assistance Equipment used: Rolling walker (2 wheeled) Transfers: Sit to/from Stand Sit to Stand: Min guard         General transfer comment: Pt demonstrated safe hand placement. Guarding for safety  Ambulation/Gait Ambulation/Gait assistance: Min guard Ambulation Distance (Feet): 65 Feet Assistive device: Rolling walker (2 wheeled) Gait Pattern/deviations: Step-through pattern;Antalgic Gait velocity: Decreased   General Gait Details: Distance limited by pain & "weakness" per pt.     Stairs Stairs: Yes Stairs assistance: Min guard Stair Management: One rail Right;Step to  pattern;Forwards Number of Stairs: 5 General stair comments: cues for sequencing & technique.  Pt relies heavily on rail with UE's to support body weight & transition trunk over LE with stepping up/down  Wheelchair Mobility    Modified Rankin (Stroke Patients Only)       Balance                                    Cognition Arousal/Alertness: Awake/alert Behavior During Therapy: WFL for tasks assessed/performed Overall Cognitive Status: Within Functional Limits for tasks assessed                      Exercises Total Joint Exercises Ankle Circles/Pumps: Both;10 reps;AROM Quad Sets: AROM;Both;10 reps Gluteal Sets: AROM;Both;10 reps Straight Leg Raises: AAROM;Strengthening;Left;10 reps    General Comments        Pertinent Vitals/Pain Pain Assessment: 0-10 Pain Score: 4  Pain Location: lt knee Pain Descriptors / Indicators: Aching Pain Intervention(s): Monitored during session;Repositioned;Premedicated before session    Home Living                      Prior Function            PT Goals (current goals can now be found in the care plan section) Acute Rehab PT Goals Patient Stated Goal: play golf by April PT Goal Formulation: With patient/family Time For Goal Achievement: 04/20/14 Potential to Achieve Goals: Good Progress towards PT goals: Progressing toward goals    Frequency  7X/week    PT Plan Current  plan remains appropriate    Co-evaluation             End of Session Equipment Utilized During Treatment: Left knee immobilizer Activity Tolerance: Patient tolerated treatment well Patient left: in chair;with call bell/phone within reach     Time: 0909-0930 PT Time Calculation (min) (ACUTE ONLY): 21 min  Charges:  $Gait Training: 8-22 mins                    G Codes:      Lara Mulch 04/15/2014, 11:28 AM   Verdell Face, PTA [phone] 04/15/2014

## 2014-04-15 NOTE — Progress Notes (Signed)
CPAP set up in Patient's room ready for use. Patient places his self on and off CPAP. He was told to call the RT if he needed any help. RT will continue to monitor.

## 2014-04-15 NOTE — Progress Notes (Signed)
SPORTS MEDICINE AND JOINT REPLACEMENT  Georgena SpurlingStephen Lucey, MD   Altamese CabalMaurice Tyjanae Bartek, PA-C [address], [place], [place]  [id number]                             [phone]   PROGRESS NOTE  Subjective:  negative for Chest Pain  negative for Shortness of Breath  negative for Nausea/Vomiting   negative for Calf Pain  negative for Bowel Movement   Tolerating Diet: yes         Patient reports pain as 8 on 0-10 scale.    Objective: Vital signs in last 24 hours:   Patient Vitals for the past 24 hrs:  BP Temp Temp src Pulse Resp SpO2  04/15/14 0450 (!) 152/68 mmHg 98.9 F (37.2 C) Oral 93 18 96 %  04/14/14 2034 (!) 159/90 mmHg 99.4 F (37.4 C) Oral (!) 108 18 96 %  04/14/14 1600 - - - - 18 -  04/14/14 1330 139/61 mmHg 98.6 F (37 C) Oral (!) 102 18 92 %  04/14/14 1200 - - - - 20 -  04/14/14 1048 122/62 mmHg 98.2 F (36.8 C) Oral 88 (!) 21 94 %    @flow {1959:LAST@   Intake/Output from previous day:   02/20 0701 - 02/21 0700 In: 1030 [P.O.:1030] Out: -    Intake/Output this shift:   02/21 0701 - 02/21 1900 In: 240 [P.O.:240] Out: -    Intake/Output      02/20 0701 - 02/21 0700 02/21 0701 - 02/22 0700   P.O. 1030 240   I.V. (mL/kg)     IV Piggyback     Total Intake(mL/kg) 1030 (8.9) 240 (2.1)   Urine (mL/kg/hr)     Drains     Blood     Total Output       Net +1030 +240        Urine Occurrence 2 x 1 x      LABORATORY DATA:  Recent Labs  04/14/14 0620 04/15/14 0602  WBC 8.2 8.2  HGB 12.6* 12.5*  HCT 36.4* 36.0*  PLT 166 166    Recent Labs  04/14/14 0620  NA 139  K 3.7  CL 105  CO2 31  BUN 7  CREATININE 0.91  GLUCOSE 151*  CALCIUM 8.2*   Lab Results  Component Value Date   INR 1.01 04/03/2014    Examination:  General appearance: alert, cooperative and no distress Extremities: Homans sign is negative, no sign of DVT  Wound Exam: clean, dry, intact   Drainage:  Scant/small amount Serosanguinous exudate  Motor Exam: EHL and FHL  Intact  Sensory Exam: Deep Peroneal normal   Assessment:    2 Days Post-Op  Procedure(s) (LRB): LEFT TOTAL KNEE ARTHROPLASTY (Left)  ADDITIONAL DIAGNOSIS:  Active Problems:   Osteoarthritis of left knee  Acute Blood Loss Anemia   Plan: Physical Therapy as ordered Weight Bearing as Tolerated (WBAT)    DISCHARGE PLAN: Home Monday           Marshall Roehrich 04/15/2014, 10:26 AM

## 2014-04-16 LAB — CBC
HEMATOCRIT: 39.3 % (ref 39.0–52.0)
HEMOGLOBIN: 13.9 g/dL (ref 13.0–17.0)
MCH: 32.4 pg (ref 26.0–34.0)
MCHC: 35.4 g/dL (ref 30.0–36.0)
MCV: 91.6 fL (ref 78.0–100.0)
Platelets: 234 10*3/uL (ref 150–400)
RBC: 4.29 MIL/uL (ref 4.22–5.81)
RDW: 12.7 % (ref 11.5–15.5)
WBC: 10.8 10*3/uL — AB (ref 4.0–10.5)

## 2014-04-16 NOTE — Care Management Note (Signed)
    Page 1 of 2   04/16/2014     10:45:24 AM CARE MANAGEMENT NOTE 04/16/2014  Patient:  Benjamin Sawyer, Benjamin Sawyer   Account Number:  [id number]  Date Initiated:  04/14/2014  Documentation initiated by:  Oliveras-Aizpurua,Jeannette  Subjective/Objective Assessment:   59 yo Male underwent L TKA.     Action/Plan:   HHPT and DME   Anticipated DC Date:  04/15/2014   Anticipated DC Plan:  [place]  CM consult      [facility] Choice  HOME HEALTH   Choice offered to / List presented to:  NA        HH arranged  HH-2 PT      [place]   Status of service:  Completed, signed off Medicare Important Message given?  YES (If response is "NO", the following Medicare IM given date fields will be blank) Date Medicare IM given:  04/16/2014 Medicare IM given by:  Lorne Skeens Date Additional Medicare IM given:   Additional Medicare IM given by:    Discharge Disposition:  [place]  Per UR Regulation:  Reviewed for med. necessity/level of care/duration of stay  If discussed at [place] of Stay Meetings, dates discussed:    Comments:  04/15/14 [place] RN, MSN, [place] with [facility] was notified of patient's plan to discharge home today. Medicare IM letter provided.   04/14/2014 1550 - Frann Rider, RN, BSN  Met with pt and wife. Discharge plan is to return home with the support of his wife. Wife stated that the RW, 3 in 1 BSC and CPM were delivered. HHPT has been arranged with [facility] prior to surgery. They agree to use [facility].

## 2014-04-16 NOTE — Discharge Summary (Signed)
PATIENT ID: Benjamin Sawyer        MRN:  [id number]          DOB/AGE: 59-Oct-1957 / 59 y.o.    DISCHARGE SUMMARY  ADMISSION DATE:    04/13/2014 DISCHARGE DATE:   04/16/2014   ADMISSION DIAGNOSIS: OA LEFT KNEE    DISCHARGE DIAGNOSIS:  OA LEFT KNEE    ADDITIONAL DIAGNOSIS: Active Problems:   Osteoarthritis of left knee  Past Medical History  Diagnosis Date  . Hypertension   . Sleep apnea     had sleep study done, will be getting a CPAP  . Bronchitis     last year  . GERD (gastroesophageal reflux disease)     OTC at times  . Arthritis   . Kidney stones     PROCEDURE: Procedure(s): LEFT TOTAL KNEE ARTHROPLASTY Left on 04/13/2014  CONSULTS: PT/OT      HISTORY:  See H&P in chart  HOSPITAL COURSE:  RJ PEDROSA is a 59 y.o. admitted on 04/13/2014 and found to have a diagnosis of OA LEFT KNEE.  After appropriate laboratory studies were obtained  they were taken to the operating room on 04/13/2014 and underwent  Procedure(s): LEFT TOTAL KNEE ARTHROPLASTY  Left.   They were given perioperative antibiotics:  Anti-infectives    Start     Dose/Rate Route Frequency Ordered Stop   04/13/14 1515  ceFAZolin (ANCEF) IVPB 2 g/50 mL premix     2 g 100 mL/hr over 30 Minutes Intravenous Every 6 hours 04/13/14 1503 04/13/14 2229   04/13/14 0600  ceFAZolin (ANCEF) IVPB 2 g/50 mL premix     2 g 100 mL/hr over 30 Minutes Intravenous On call to O.R. 04/12/14 1300 04/13/14 0754    .  Tolerated the procedure well.   POD #1, allowed out of bed to a chair.  PT for ambulation and exercise program.   IV saline locked.  O2 discontionued.  POD #2, continued PT and ambulation.   Hemovac pulled.  The remainder of the hospital course was dedicated to ambulation and strengthening.   The patient was discharged on 3 Days Post-Op in  Stable condition.  Blood products given:none  DIAGNOSTIC STUDIES: Recent vital signs: Patient Vitals for the past 24 hrs:  BP Temp Temp src Pulse Resp SpO2   04/16/14 0423 120/62 mmHg 99.2 F (37.3 C) Oral 84 18 95 %  04/15/14 2100 129/69 mmHg 99.1 F (37.3 C) Oral (!) 104 - 95 %  04/15/14 1450 128/64 mmHg 98.7 F (37.1 C) Oral (!) 105 18 95 %       Recent laboratory studies:  Recent Labs  04/14/14 0620 04/15/14 0602 04/16/14 0436  WBC 8.2 8.2 10.8*  HGB 12.6* 12.5* 13.9  HCT 36.4* 36.0* 39.3  PLT 166 166 234    Recent Labs  04/14/14 0620  NA 139  K 3.7  CL 105  CO2 31  BUN 7  CREATININE 0.91  GLUCOSE 151*  CALCIUM 8.2*   Lab Results  Component Value Date   INR 1.01 04/03/2014     Recent Radiographic Studies :  Dg Chest 2 View  04/03/2014   CLINICAL DATA:  Knee replacement.  Hypertension.  EXAM: CHEST  2 VIEW  COMPARISON:  None.  FINDINGS: Mediastinum and hilar structures normal. Lungs are clear. Heart size normal. No pleural effusion or pneumothorax. No acute bony abnormality.  IMPRESSION: No active cardiopulmonary disease.   Electronically Signed   By: Maisie Fus  Register   On:  04/03/2014 13:38    DISCHARGE INSTRUCTIONS:   DISCHARGE MEDICATIONS:     Medication List    STOP taking these medications        ALEVE 220 MG tablet  Generic drug:  naproxen sodium     HYDROcodone-acetaminophen 5-325 MG per tablet  Commonly known as:  NORCO/VICODIN      TAKE these medications        amLODipine 5 MG tablet  Commonly known as:  NORVASC  Take 5 mg by mouth daily.     apixaban 2.5 MG Tabs tablet  Commonly known as:  ELIQUIS  Take 1 tablet (2.5 mg total) by mouth 2 (two) times daily.     cyclobenzaprine 10 MG tablet  Commonly known as:  FLEXERIL  Take 10 mg by mouth 3 (three) times daily as needed for muscle spasms.     lisinopril 20 MG tablet  Commonly known as:  PRINIVIL,ZESTRIL  Take 20 mg by mouth daily.     oxyCODONE-acetaminophen 5-325 MG per tablet  Commonly known as:  ROXICET  Take 1-2 tablets by mouth every 4 (four) hours as needed for severe pain.        FOLLOW UP VISIT:       Follow-up  Information    Follow up with CAFFREY JR,W D, MD. Schedule an appointment as soon as possible for a visit in 2 weeks.   Specialty:  Orthopedic Surgery   Contact information:   [address] [place] [address] [phone]       DISPOSITION:   Home  CONDITION:  Stable  Margart SicklesJoshua Iva Posten, PA-C  04/16/2014 8:26 AM

## 2014-04-16 NOTE — Progress Notes (Signed)
Subjective: 3 Days Post-Op Procedure(s) (LRB): LEFT TOTAL KNEE ARTHROPLASTY (Left) Patient reports pain as mild and moderate.    Objective: Vital signs in last 24 hours: Temp:  [98.7 F (37.1 C)-99.2 F (37.3 C)] 99.2 F (37.3 C) (02/22 0423) Pulse Rate:  [84-105] 84 (02/22 0423) Resp:  [18] 18 (02/22 0423) BP: (120-129)/(62-69) 120/62 mmHg (02/22 0423) SpO2:  [95 %] 95 % (02/22 0423)  Intake/Output from previous day: 02/21 0701 - 02/22 0700 In: 480 [P.O.:480] Out: -  Intake/Output this shift:     Recent Labs  04/14/14 0620 04/15/14 0602 04/16/14 0436  HGB 12.6* 12.5* 13.9    Recent Labs  04/15/14 0602 04/16/14 0436  WBC 8.2 10.8*  RBC 3.93* 4.29  HCT 36.0* 39.3  PLT 166 234    Recent Labs  04/14/14 0620  NA 139  K 3.7  CL 105  CO2 31  BUN 7  CREATININE 0.91  GLUCOSE 151*  CALCIUM 8.2*   No results for input(s): LABPT, INR in the last 72 hours.  Neurovascular intact Sensation intact distally Intact pulses distally Dorsiflexion/Plantar flexion intact Incision: dressing C/D/I Compartment soft  Assessment/Plan: 3 Days Post-Op Procedure(s) (LRB): LEFT TOTAL KNEE ARTHROPLASTY (Left) Up with therapy Discharge home with home health  Margart SicklesChadwell, Laconda Basich 04/16/2014, 8:25 AM

## 2014-04-16 NOTE — Progress Notes (Signed)
Physical Therapy Treatment Patient Details Name: Benjamin CampJackie D Hartwig MRN: [id number] DOB: 09-22-1955 Today's Date: 04/16/2014    History of Present Illness Patient is a 59 yo male admitted 04/13/14 now s/p Lt TKA.  PMH:  arthritis    PT Comments    Pt with improved ambulation tolerance this date but remains to have weak quad set and knee flexion limited by pain. Pt with improved stair negotiation as well. Pt safe to d/c home with spouse once medically stable.   Follow Up Recommendations  Home health PT;Supervision/Assistance - 24 hour     Equipment Recommendations  None recommended by PT    Recommendations for Other Services       Precautions / Restrictions Precautions Precautions: Knee Restrictions Weight Bearing Restrictions: Yes LLE Weight Bearing: Weight bearing as tolerated    Mobility  Bed Mobility Overal bed mobility: Needs Assistance Bed Mobility: Supine to Sit           General bed mobility comments: labored effort, pt con't to utilize bed rail despite v/c's to complete long sit as pt wont have rails at home.encouraged pt to complete quad set and then abd L LE off EOB however pt con't to use R LE to assist L LE off bed and lowered to ground  Transfers Overall transfer level: Needs assistance Equipment used: Rolling walker (2 wheeled) Transfers: Sit to/from Stand Sit to Stand: Supervision         General transfer comment: Pt demonstrated safe hand placement. Guarding for safety  Ambulation/Gait Ambulation/Gait assistance: Supervision Ambulation Distance (Feet): 200 Feet Assistive device: Rolling walker (2 wheeled) Gait Pattern/deviations: Step-through pattern;Decreased stride length;Antalgic Gait velocity: Decreased Gait velocity interpretation: Below normal speed for age/gender General Gait Details: minA to facilitate fluid step through gait pattern by initiatlly con't to push RW. Pt con't to rely heavily on UEs despite v/c's to increase L LE WBing and  decrease bilat UE WBing   Stairs Stairs: Yes Stairs assistance: Min guard Stair Management: One rail Right;Step to pattern Number of Stairs: 2 General stair comments: cues for sequencing & technique.  Pt relies heavily on rail with UE's to support body weight & transition trunk over LE with stepping up/down  Wheelchair Mobility    Modified Rankin (Stroke Patients Only)       Balance                                    Cognition Arousal/Alertness: Awake/alert Behavior During Therapy: WFL for tasks assessed/performed Overall Cognitive Status: Within Functional Limits for tasks assessed                      Exercises Total Joint Exercises Ankle Circles/Pumps: Both;10 reps;AROM Quad Sets: AROM;Both;10 reps (with 10 sec hold) Heel Slides: AROM;AAROM;Left;10 reps;Seated Goniometric ROM: 6-65 deg in sitting    General Comments        Pertinent Vitals/Pain Pain Assessment: 0-10 Pain Score: 5  (7 during ther ex) Pain Location: Lt knee Pain Descriptors / Indicators: Aching Pain Intervention(s): Monitored during session    Home Living                      Prior Function            PT Goals (current goals can now be found in the care plan section) Progress towards PT goals: Progressing toward goals    Frequency  7X/week  PT Plan Current plan remains appropriate    Co-evaluation             End of Session Equipment Utilized During Treatment: Gait belt Activity Tolerance: Patient tolerated treatment well Patient left: in chair;with call bell/phone within reach     Time: 0813-0841 PT Time Calculation (min) (ACUTE ONLY): 28 min  Charges:  $Gait Training: 8-22 mins $Therapeutic Exercise: 8-22 mins                    G Codes:      Marcene Brawn 04/16/2014, 8:48 AM   Lewis Shock, PT, DPT Pager #: [phone] Office #: [phone]

## 2014-08-10 IMAGING — CR DG FOOT COMPLETE 3+V*R*
3 series · 3 of 3 positions shown · non-contrast
Comparison: None.

CLINICAL DATA: Right foot injury.  Lateral pain.  Plantar pain.

RIGHT FOOT COMPLETE - 3+ VIEW

[ap]
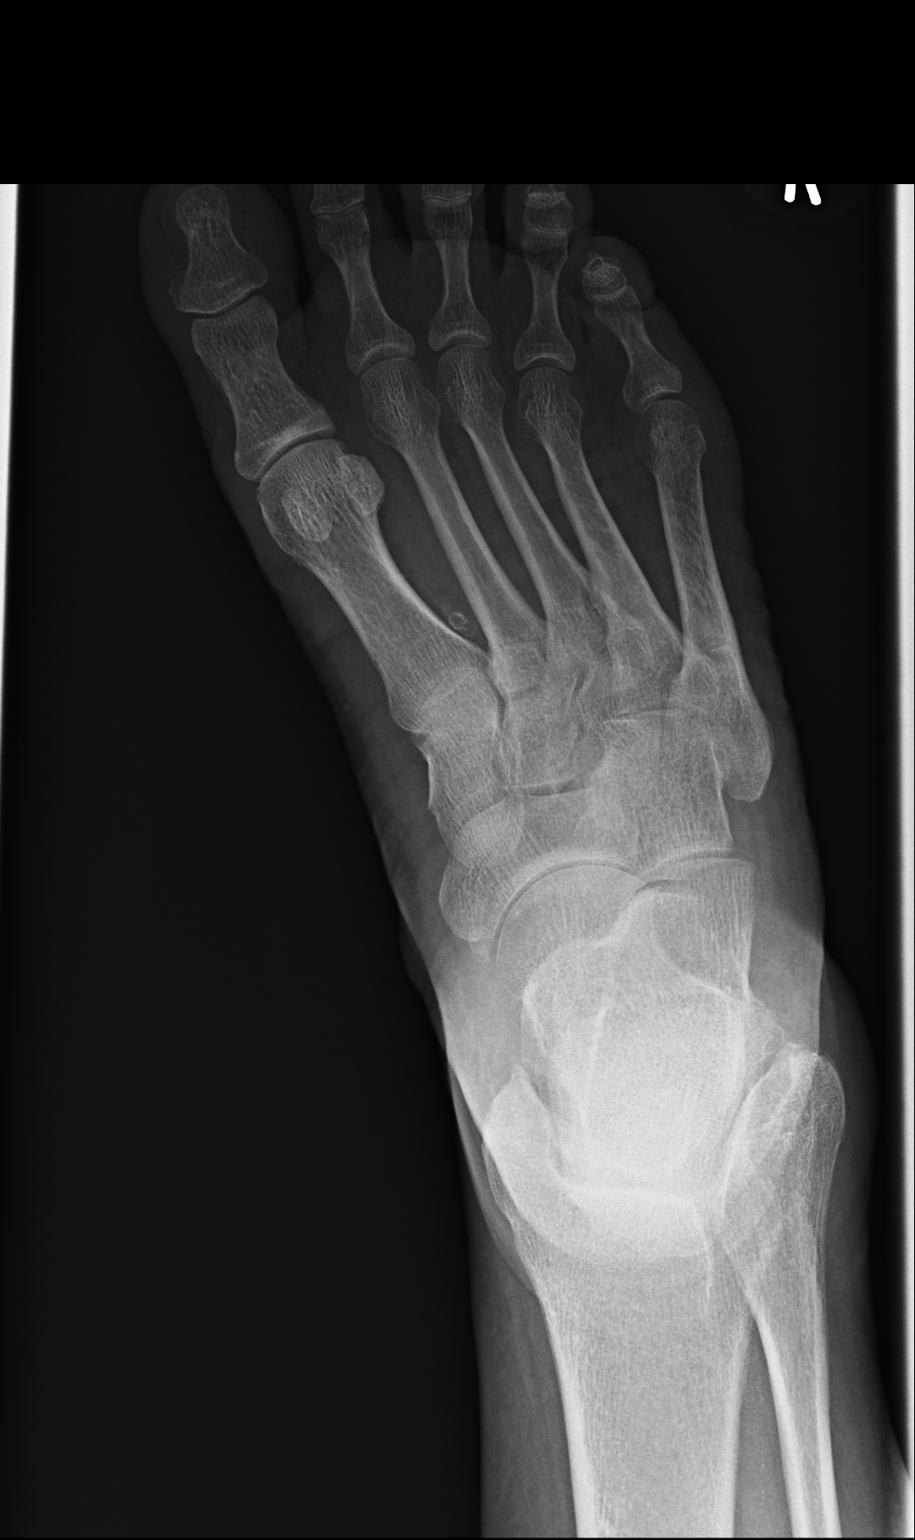

[oblique]
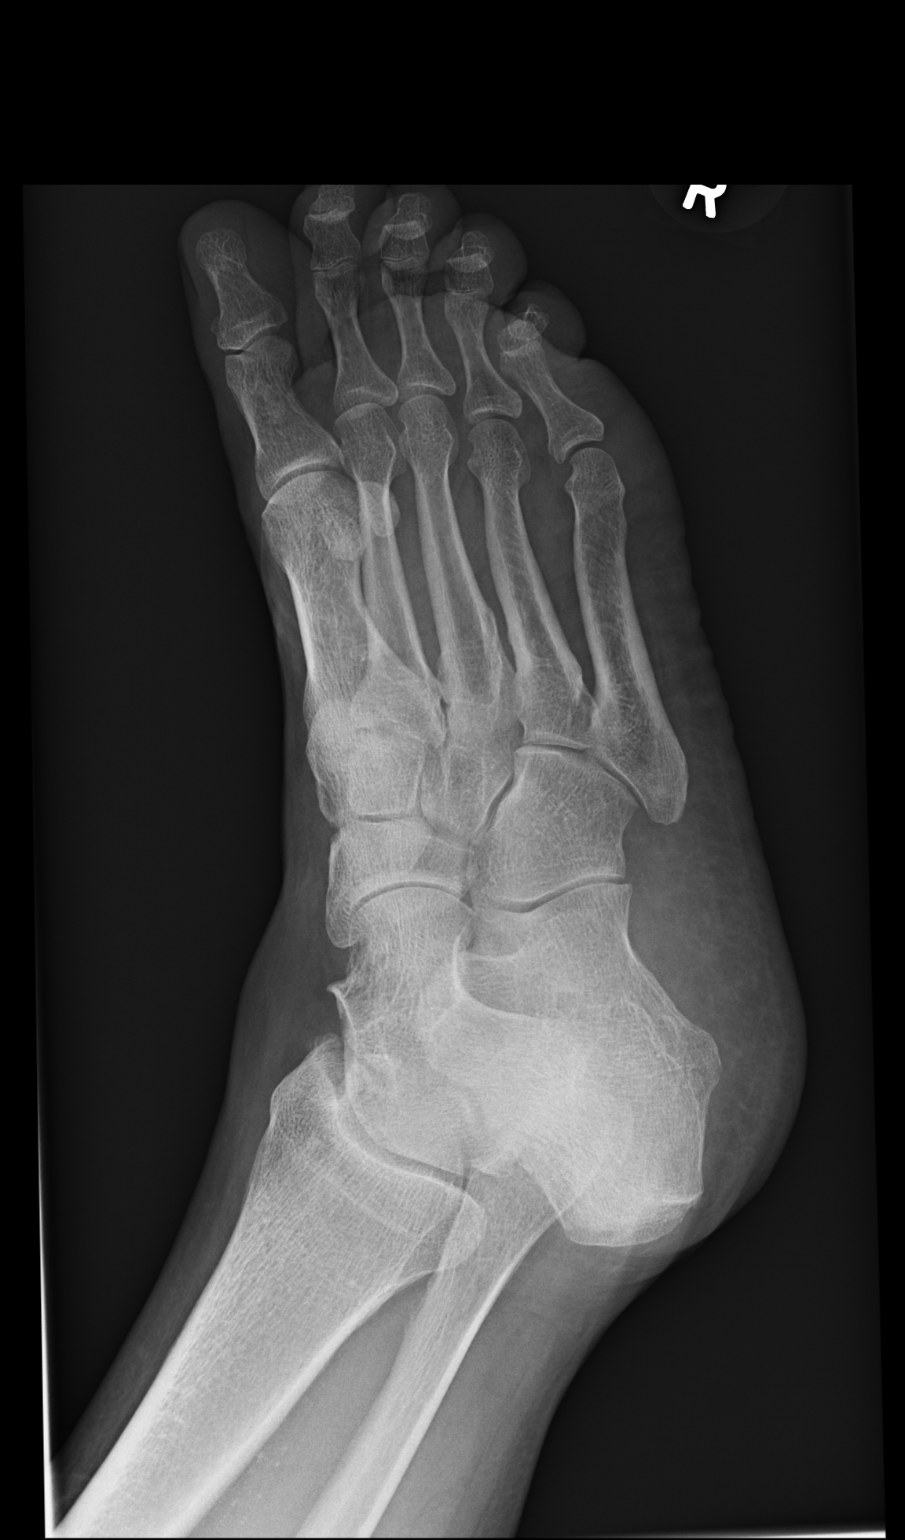

[lat]
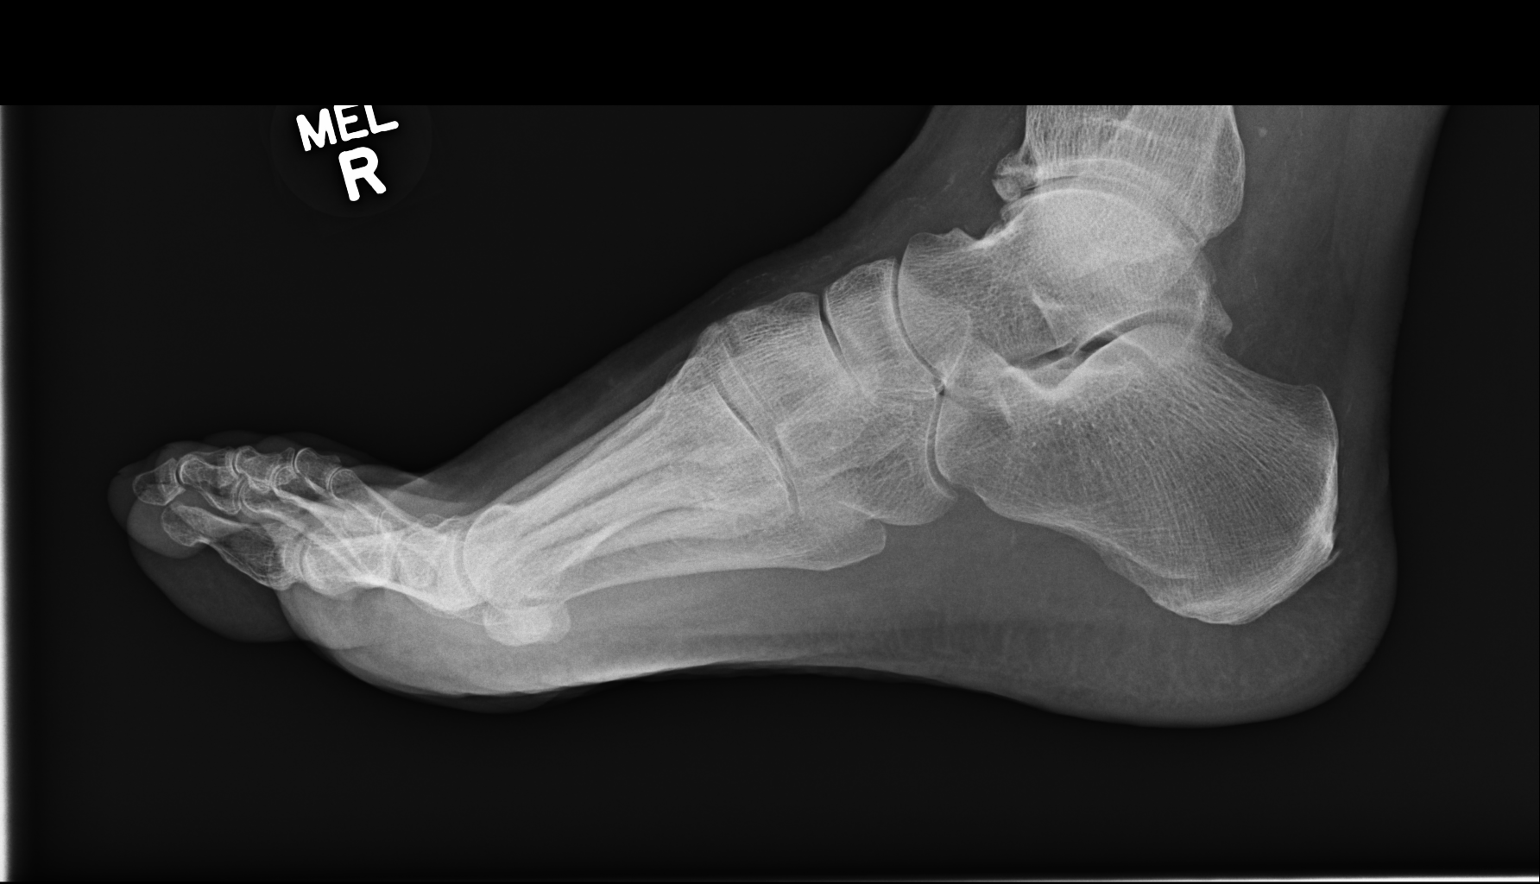

[3 of 3 positions shown; findings below may reference images not displayed]

FINDINGS: Atherosclerotic arterial calcification noted.  No
malalignment at the Lisfranc joint.

Fragmented spurring noted along the anterior distal tibial rim.

Fifth metatarsal intact.  Hind foot and midfoot intact.  First
digit sesamoids unremarkable.  No additional significant findings.
IMPRESSION: 1.  Fragmented spurring along the anterior tibial rim.  No specific
fracture or acute bony findings to explain the patient's plantar
and lateral pain.  If symptoms persist despite conservative
therapy, MRI followup may be warranted.

## 2014-08-17 IMAGING — CT CT ABD-PELV W/O CM
2 of 3 series · 17 of 46 positions shown, 19 images · non-contrast
Comparison: 09/26/2004

CLINICAL DATA: Right flank pain.

CT ABDOMEN AND PELVIS WITHOUT CONTRAST
TECHNIQUE: Multidetector CT imaging of the abdomen and pelvis was
performed following the standard protocol without intravenous
contrast.

[Series 2: standard/full over (age)lbs 5.0 · axial · 0.84mm/px · z∈[-246,+179]mm · 14 of 99 slices shown, 16 images]
[im 7/99  soft-tissue]
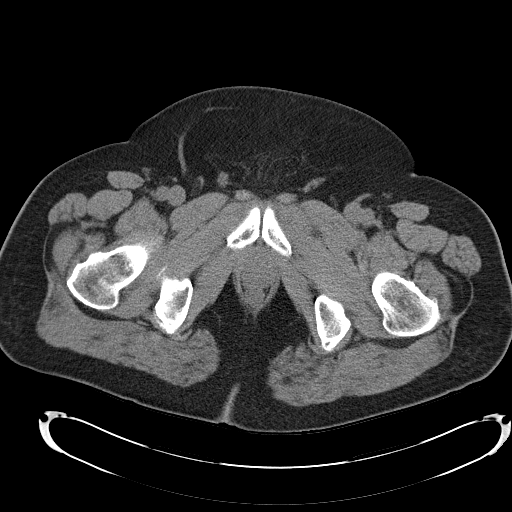
[im 7/99  bone]
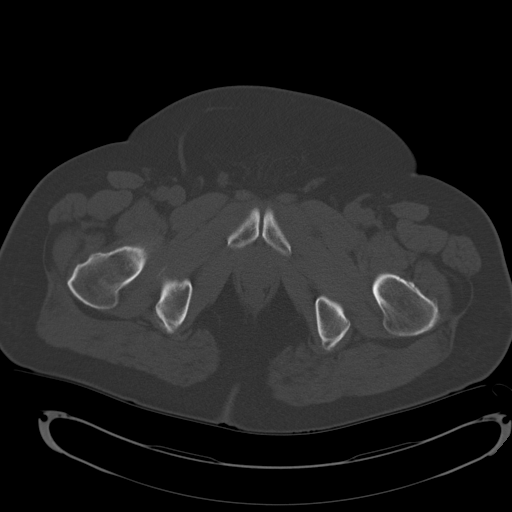
[im 13/99  soft-tissue]
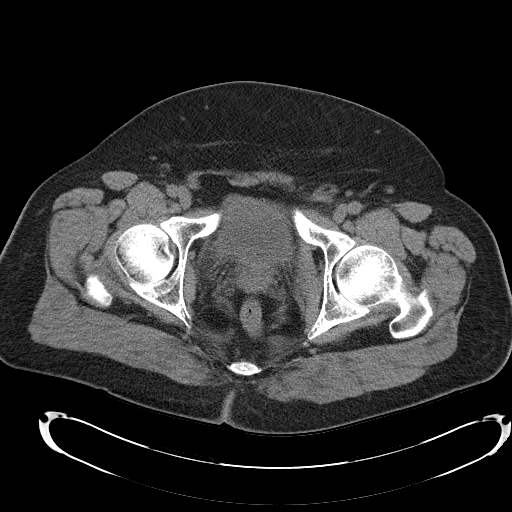
[im 19/99  soft-tissue]
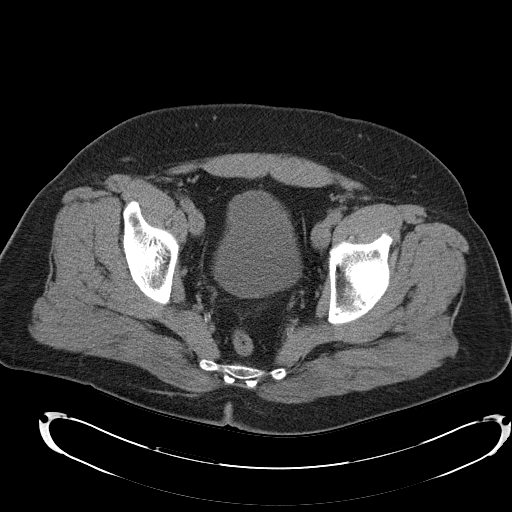
[im 26/99  soft-tissue]
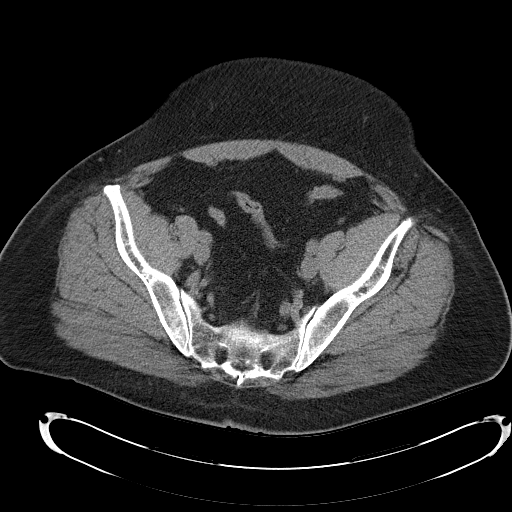
[im 32/99  soft-tissue]
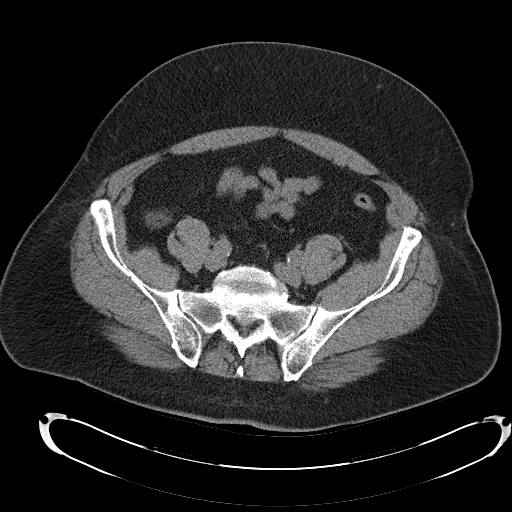
[im 38/99  soft-tissue]
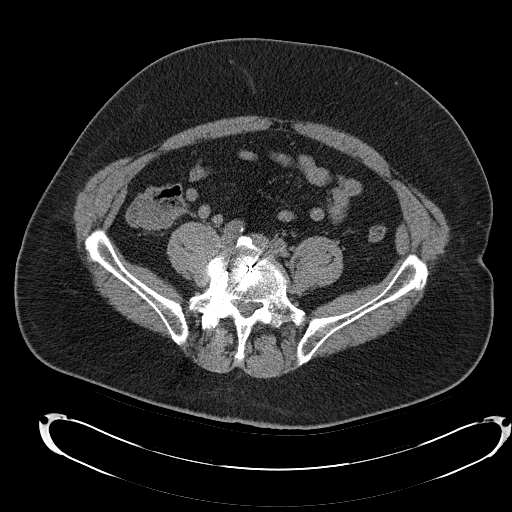
[im 45/99  soft-tissue]
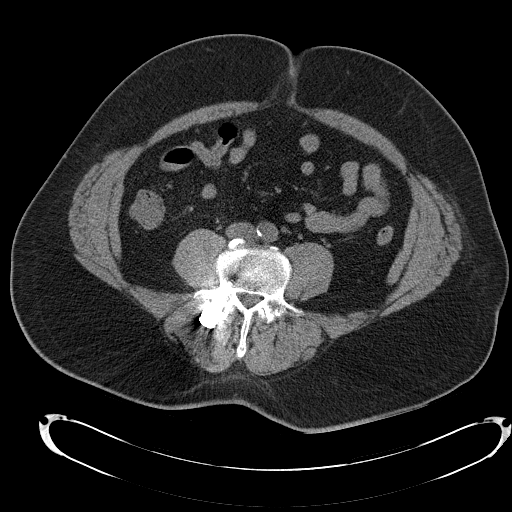
[im 54/99  soft-tissue]
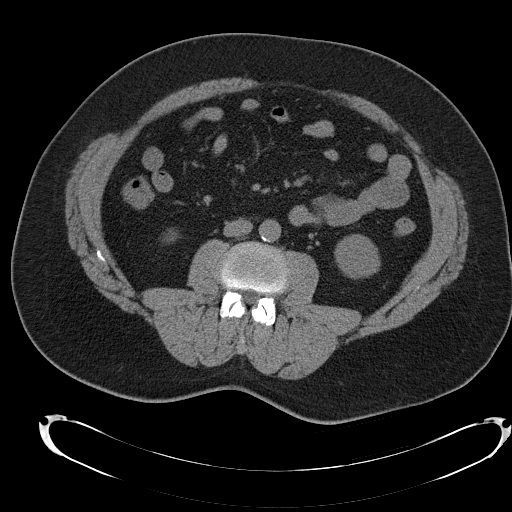
[im 61/99  soft-tissue]
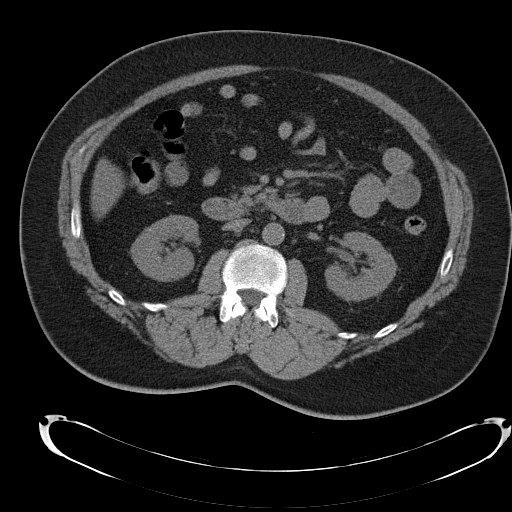
[im 61/99  bone]
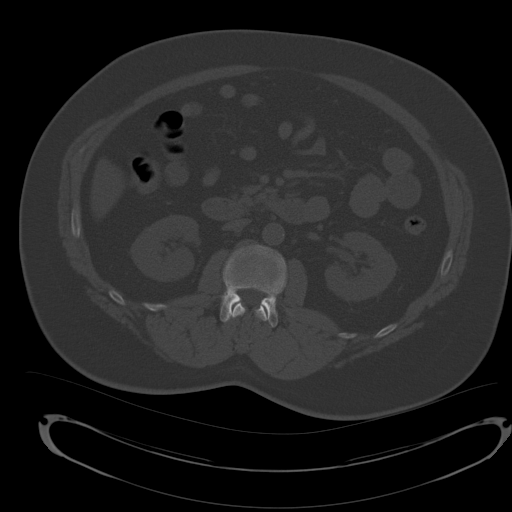
[im 67/99  soft-tissue]
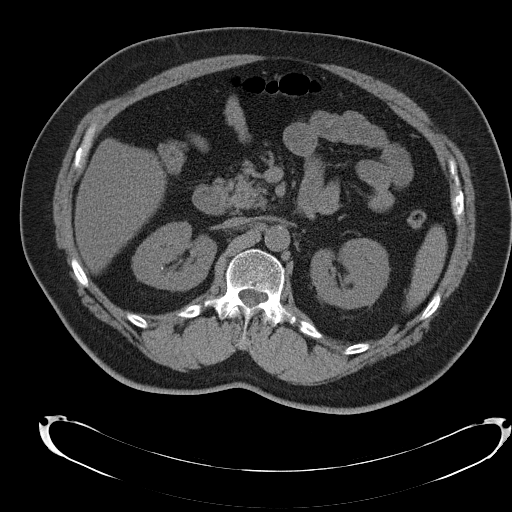
[im 73/99  soft-tissue]
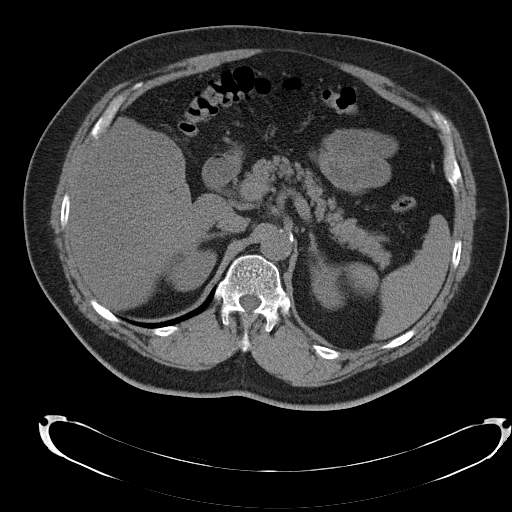
[im 80/99  soft-tissue]
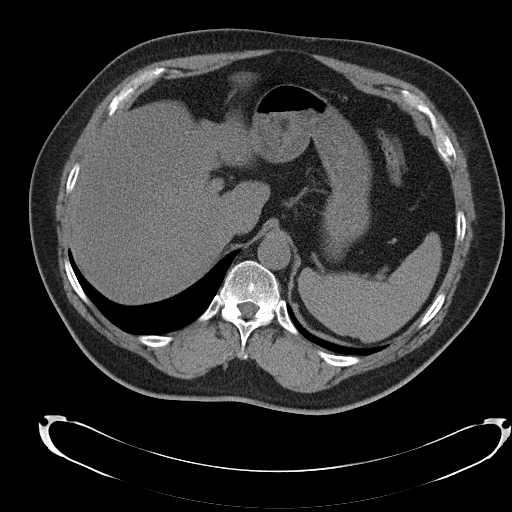
[im 86/99  soft-tissue]
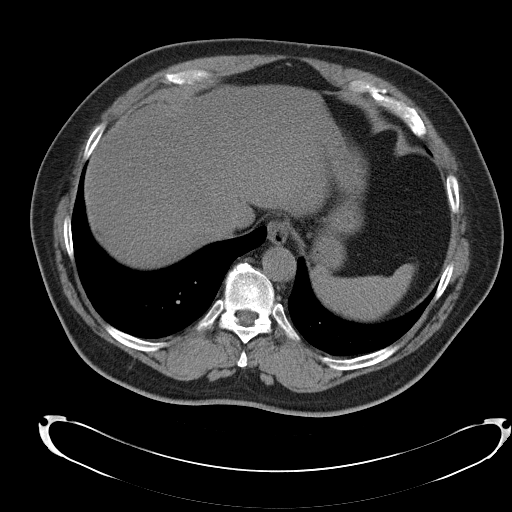
[im 92/99  soft-tissue]
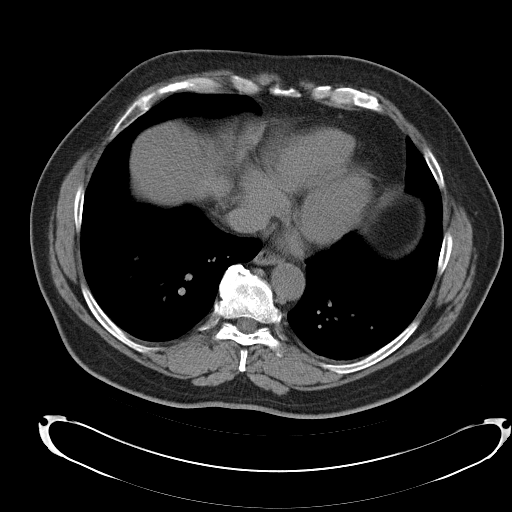

[Series 4: mpr coronal · coronal · 0.77mm/px · 3 of 102 slices shown]
[im 34/102  soft-tissue]
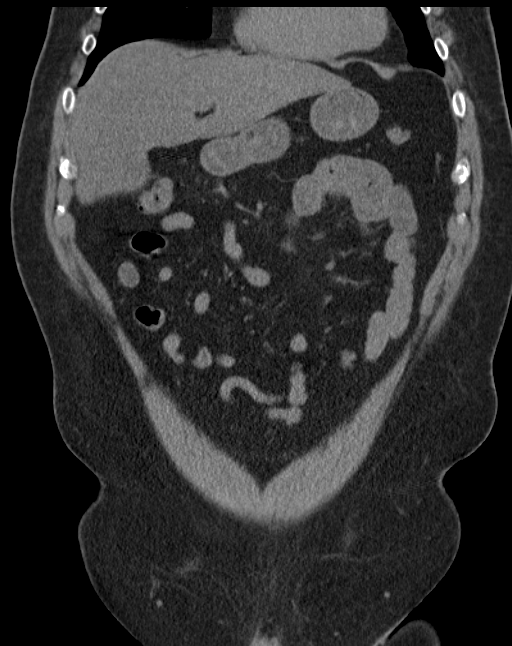
[im 45/102  soft-tissue]
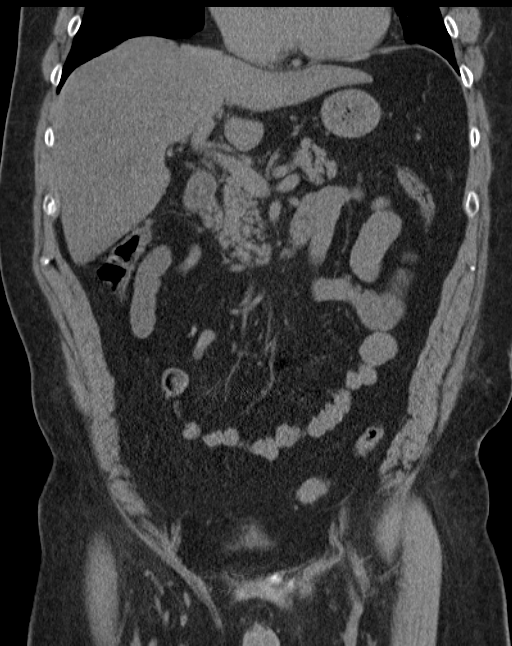
[im 57/102  soft-tissue]
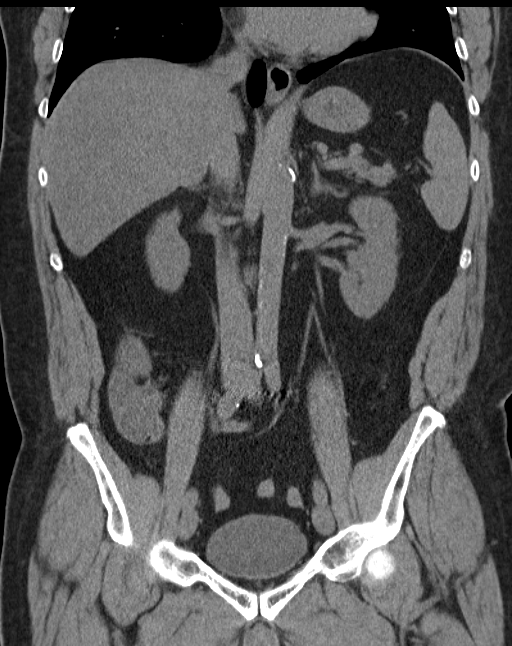

[17 of 46 positions shown; findings below may reference images not displayed]

FINDINGS: Lung bases are clear.  No effusions.  Prior
cholecystectomy.  Liver, spleen, pancreas, adrenals have an
unremarkable unenhanced appearance.

There are punctate nonobstructing bilateral renal stones.  No
ureteral stones.  No hydronephrosis.  Urinary bladder and prostate
are unremarkable.

Stomach, large and small bowel grossly unremarkable.  No acute bony
abnormality.  No free fluid, free air or adenopathy.  Postoperative
changes in the lumbar spine.
IMPRESSION: Bilateral nephrolithiasis.  No ureteral stones or hydronephrosis.
